# Patient Record
Sex: Female | Born: 1985 | Race: White | Hispanic: No | Marital: Single | Smoking: Never smoker
Health system: Southern US, Community
[De-identification: ages and names within clinical notes are randomized; demographics above are authoritative.]

## PROBLEM LIST (undated history)

## (undated) HISTORY — PX: BREAST ENHANCEMENT SURGERY: SHX7

## (undated) HISTORY — PX: TONSILLECTOMY: SUR1361

## (undated) HISTORY — PX: BREAST SURGERY: SHX581

## (undated) HISTORY — PX: OTHER SURGICAL HISTORY: SHX169

---
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
## 2008-09-26 ENCOUNTER — Emergency Department: Payer: Self-pay | Admitting: Unknown Physician Specialty

## 2011-08-07 ENCOUNTER — Emergency Department: Payer: Self-pay | Admitting: Emergency Medicine

## 2012-04-17 ENCOUNTER — Emergency Department: Payer: Self-pay | Admitting: *Deleted

## 2012-04-17 LAB — URINALYSIS, COMPLETE
Blood: NEGATIVE
Glucose,UR: NEGATIVE mg/dL (ref 0–75)
Leukocyte Esterase: NEGATIVE
Ph: 7 (ref 4.5–8.0)
Protein: NEGATIVE
RBC,UR: 1 /HPF (ref 0–5)
Specific Gravity: 1.018 (ref 1.003–1.030)
Squamous Epithelial: <1

## 2012-04-17 LAB — COMPREHENSIVE METABOLIC PANEL
Bilirubin,Total: 0.5 mg/dL (ref 0.2–1.0)
Chloride: 107 mmol/L (ref 98–107)
Co2: 25 mmol/L (ref 21–32)
Creatinine: 0.79 mg/dL (ref 0.60–1.30)
Glucose: 100 mg/dL — ABNORMAL HIGH (ref 65–99)
Osmolality: 287 (ref 275–301)
Potassium: 3.4 mmol/L — ABNORMAL LOW (ref 3.5–5.1)
SGOT(AST): 25 U/L (ref 15–37)
Total Protein: 7.3 g/dL (ref 6.4–8.2)

## 2012-04-17 LAB — CBC: MCH: 29.6 pg (ref 26.0–34.0)

## 2012-04-17 LAB — PREGNANCY, URINE: Pregnancy Test, Urine: NEGATIVE m[IU]/mL

## 2012-04-17 LAB — LIPASE, BLOOD: Lipase: 135 U/L (ref 73–393)

## 2012-07-09 ENCOUNTER — Ambulatory Visit: Payer: Self-pay | Admitting: Otolaryngology

## 2012-07-18 ENCOUNTER — Observation Stay: Payer: Self-pay | Admitting: Otolaryngology

## 2014-12-31 ENCOUNTER — Emergency Department: Payer: Self-pay | Admitting: Emergency Medicine

## 2015-03-01 NOTE — H&P (Signed)
PATIENT NAME:  Tammy PandaBNER, Tammy Tammy [name] MR#:  [id number] DATE OF BIRTH:  [birth year]  DATE OF ADMISSION:  07/18/2012  HISTORY OF PRESENT ILLNESS: The patient is Tammy [name] 29 year old white female who had Tammy [name] tonsillectomy 1 week ago with Dr. Bud Facereighton Vaught. She was brushing her teeth this evening, does not recall specifically that she hit her left oropharynx but started bleeding. She irrigated with cold water. The bleeding seemed to stop momentarily but then "broke loose again." She was initially evaluated in the emergency room by Dr. Manson PasseyBrown who immediately called me. I attempted cautery in the emergency room but she was unable to tolerate that cautery very well, and she continued to bleed primarily from the inferior pole of the left tonsil. Tammy [name] decision was made to take her to the operating room. Please see separate documentation for operative findings.   ALLERGIES: None.   MEDICATIONS: Lortab and Augmentin.   PAST MEDICAL HISTORY: Recurrent tonsillitis.   PAST SURGICAL HISTORY: Tonsillectomy 1 week ago.   SOCIAL HISTORY: The patient is Tammy [name] nonsmoker.   PHYSICAL EXAMINATION: There is constant bleeding from the left inferior tonsillar pole.  Remainder of her head and neck exam is normal.  IMPRESSION: Post-tonsillectomy hemorrhage. We have elected to go to the operating room, where we will get complete control of the bleeding.     ____________________________ J. Gertie BaronMadison Zyrah Wiswell, MD jmc:vtd D: 07/18/2012 07:49:56 ET   T: 07/18/2012 11:50:34 ET   JOB#: [id number] cc: Zackery BarefootJ. Madison Antario Yasuda, MD, <Dictator> Glorious PeachSonya Thompson [facility] Wendee CoppJMADISON Tallulah Hosman MD ELECTRONICALLY SIGNED 07/26/2012 10:43

## 2015-03-01 NOTE — Op Note (Signed)
PATIENT NAME:  Tammy PandaBNER, Tammy [name] MR#:  [id number] DATE OF BIRTH:  07-Sep-1986  DATE OF PROCEDURE:  07/18/2012  PREOPERATIVE DIAGNOSIS:  Post-tonsillectomy hemorrhage.   POSTOPERATIVE DIAGNOSIS:  Post-tonsillectomy hemorrhage.   PROCEDURE: Control of post-tonsillectomy hemorrhage.   SURGEON:  Gertie BaronMadison Nohelani Benning, M.D.   DESCRIPTION OF PROCEDURE: The patient was placed in the supine position on the operating room table. After general endotracheal anesthesia had been induced, the patient was turned from anesthesia and the tonsillectomy retractor was placed and suspended off the [employer] stand. The oropharynx was suctioned of significant fresh blood.  The primary bleeding source was in the left inferior tonsillar pole where suction cautery was used to stop the venous type bleeding. The remaining tonsillar eschar was irrigated and gently with [name] gloved finger rubbed to make sure that no additional bleeding was encountered. The patient was given [name] Valsalva maneuver and no additional bleeding was encountered with that after [name] very conservative amount of cautery was used for any additional small bleeding. The stomach was then suctioned clear of coagulated dark blood (approximately 150 mL). The nasopharynx was irrigated. The patient was then returned to anesthesia, allowed to emerge from anesthesia, extubated in the operating room, and taken to the recovery room in stable condition. There were no complications. Estimated blood loss 15 mL.   ____________________________ J. Gertie BaronMadison Verdella Laidlaw, MD jmc:bjt D: 07/18/2012 07:46:34 ET T: 07/18/2012 13:30:28 ET JOB#: [id number]  cc: Zackery BarefootJ. Madison Ardeth Repetto, MD, <Dictator> Wendee CoppJMADISON Jonathen Rathman MD ELECTRONICALLY SIGNED 07/26/2012 10:43

## 2015-04-20 LAB — OB RESULTS CONSOLE HIV ANTIBODY (ROUTINE TESTING): HIV: NONREACTIVE

## 2015-04-20 LAB — OB RESULTS CONSOLE HEPATITIS B SURFACE ANTIGEN: HEP B S AG: NEGATIVE

## 2015-04-20 LAB — OB RESULTS CONSOLE RUBELLA ANTIBODY, IGM: RUBELLA: IMMUNE

## 2015-04-20 LAB — OB RESULTS CONSOLE VARICELLA ZOSTER ANTIBODY, IGG: Varicella: IMMUNE

## 2015-07-19 LAB — OB RESULTS CONSOLE GBS: STREP GROUP B AG: NEGATIVE

## 2015-07-19 LAB — OB RESULTS CONSOLE GC/CHLAMYDIA
CHLAMYDIA, DNA PROBE: NEGATIVE
Gonorrhea: NEGATIVE

## 2015-08-01 ENCOUNTER — Encounter
Admission: RE | Admit: 2015-08-01 | Discharge: 2015-08-01 | Disposition: A | Discharge status: Home / Self Care | Payer: BLUE CROSS/BLUE SHIELD | Source: Ambulatory Visit | Attending: Obstetrics and Gynecology | Admitting: Obstetrics and Gynecology

## 2015-08-01 LAB — CBC
HEMATOCRIT: 37 % (ref 35.0–47.0)
HEMOGLOBIN: 12.3 g/dL (ref 12.0–16.0)
MCH: 28.8 pg (ref 26.0–34.0)
MCHC: 33.3 g/dL (ref 32.0–36.0)
MCV: 86.6 fL (ref 80.0–100.0)
PLATELETS: 214 10*3/uL (ref 150–440)
RBC: 4.28 MIL/uL (ref 3.80–5.20)
RDW: 13.9 % (ref 11.5–14.5)
WBC: 9.3 10*3/uL (ref 3.6–11.0)

## 2015-08-01 LAB — RAPID HIV SCREEN (HIV 1/2 AB+AG)
HIV 1/2 Antibodies: NONREACTIVE
HIV-1 P24 Antigen - HIV24: NONREACTIVE

## 2015-08-01 LAB — TYPE AND SCREEN
ABO/RH(D): O POS
ANTIBODY SCREEN: NEGATIVE

## 2015-08-01 NOTE — Plan of Care (Signed)
Tammy [name]   Your procedure is scheduled on: 08/02/2015   Report to [facility] at 0530am.             [facility]             [address]             [place], [place] [address]  Call this number if you have problems the morning of surgery: [phone]   Remember:   Do not eat food or drink liquids after midnight.     Do not wear jewelry, make-up or nail polish.  Do not wear lotions, powders, or perfumes. You may wear deodorant.  Do not shave prior to surgery.  Do not bring valuables to the hospital.  [facility] is not responsible for any            belongings or valuables.                Contacts, dentures or bridgework may not be worn into surgery.  Leave suitcase in the car. After surgery it may be brought to your room.  For patients admitted to the hospital, discharge time is determined by your             treatment team.               Special Instructions: Preparing the skin before Cesarean Section              To help prevent the risk of infection at your surgical site, we are providing             you with rinse-free Sage 2% Chlorhexidine Gluconate (HCG) disposable             wipes.               The night before surgery:              1. Shower or bathe with warm water             2. Do not apply lotion or perfume             3. Wait one hour after shower, skin should be dry and cool             4. Open Sage wipe package - 2 disposable cloths are inside             5. Wipe the lower abdomen from the pubic line to the navel and hip bone to hip             bone with one cloth             6. Use the second cloth to wipe the front of the upper thighs             7. Allow the area to dry for one minute. DO NOT RINSE             8. Skin may feel "tacky" for several minutes             9. Dress in freshly laundered, clean clothes           10. Do not shower the morning of surgery    Please read over the following fact sheets  that you were given: Coughing and            Deep Breathing and  Surgical Site Infection Prevention

## 2015-08-02 ENCOUNTER — Inpatient Hospital Stay
Admission: RE | Admit: 2015-08-02 | Discharge: 2015-08-05 | DRG: 765 | Disposition: A | Discharge status: Home / Self Care | Payer: BLUE CROSS/BLUE SHIELD | Source: Ambulatory Visit | Attending: Obstetrics and Gynecology | Admitting: Obstetrics and Gynecology

## 2015-08-02 ENCOUNTER — Encounter
Admission: RE | Disposition: A | Discharge status: Home / Self Care | Payer: Self-pay | Source: Ambulatory Visit | Attending: Obstetrics and Gynecology

## 2015-08-02 ENCOUNTER — Inpatient Hospital Stay: Payer: BLUE CROSS/BLUE SHIELD | Admitting: Anesthesiology

## 2015-08-02 ENCOUNTER — Encounter: Payer: Self-pay | Admitting: Anesthesiology

## 2015-08-02 DIAGNOSIS — O9081 Anemia of the puerperium: Secondary | ICD-10-CM | POA: Diagnosis not present

## 2015-08-02 DIAGNOSIS — D62 Acute posthemorrhagic anemia: Secondary | ICD-10-CM | POA: Diagnosis not present

## 2015-08-02 DIAGNOSIS — Z3A39 39 weeks gestation of pregnancy: Secondary | ICD-10-CM | POA: Diagnosis present

## 2015-08-02 DIAGNOSIS — O329XX0 Maternal care for malpresentation of fetus, unspecified, not applicable or unspecified: Secondary | ICD-10-CM

## 2015-08-02 DIAGNOSIS — O3421 Maternal care for scar from previous cesarean delivery: Secondary | ICD-10-CM | POA: Diagnosis present

## 2015-08-02 DIAGNOSIS — O321XX0 Maternal care for breech presentation, not applicable or unspecified: Secondary | ICD-10-CM | POA: Diagnosis present

## 2015-08-02 DIAGNOSIS — O0990 Supervision of high risk pregnancy, unspecified, unspecified trimester: Secondary | ICD-10-CM

## 2015-08-02 DIAGNOSIS — Z98891 History of uterine scar from previous surgery: Secondary | ICD-10-CM

## 2015-08-02 HISTORY — DX: Maternal care for malpresentation of fetus, unspecified, not applicable or unspecified: O32.9XX0

## 2015-08-02 HISTORY — DX: Supervision of high risk pregnancy, unspecified, unspecified trimester: O09.90

## 2015-08-02 HISTORY — DX: History of uterine scar from previous surgery: Z98.891

## 2015-08-02 LAB — RPR: RPR: NONREACTIVE

## 2015-08-02 LAB — ABO/RH: ABO/RH(D): O POS

## 2015-08-02 SURGERY — Surgical Case
Anesthesia: Spinal

## 2015-08-02 MED ORDER — NALBUPHINE HCL 10 MG/ML IJ SOLN
5.0000 mg | INTRAMUSCULAR | Status: DC | PRN
  Filled 2015-08-02: qty 0.5

## 2015-08-02 MED ORDER — BUPIVACAINE HCL 0.5 % IJ SOLN
INTRAMUSCULAR | Status: DC | PRN
Start: 2015-08-02 — End: 2015-08-02
  Administered 2015-08-02: 10 mL

## 2015-08-02 MED ORDER — PRENATAL MULTIVITAMIN CH
1.0000 | ORAL_TABLET | Freq: Every day | ORAL | Status: DC
  Administered 2015-08-03 – 2015-08-04 (×2): 1 via ORAL
  Filled 2015-08-02 (×2): qty 1

## 2015-08-02 MED ORDER — NALBUPHINE HCL 10 MG/ML IJ SOLN
5.0000 mg | Freq: Once | INTRAMUSCULAR | Status: DC | PRN
  Filled 2015-08-02: qty 0.5

## 2015-08-02 MED ORDER — BUPIVACAINE 0.25 % ON-Q PUMP DUAL CATH 400 ML
400.0000 mL | INJECTION | Status: DC
  Filled 2015-08-02: qty 400

## 2015-08-02 MED ORDER — FENTANYL CITRATE (PF) 100 MCG/2ML IJ SOLN
INTRAMUSCULAR | Status: DC | PRN
  Administered 2015-08-02: 150 ug via INTRAVENOUS

## 2015-08-02 MED ORDER — LANOLIN HYDROUS EX OINT: 1.0000 "application " | TOPICAL_OINTMENT | CUTANEOUS | Status: DC | PRN

## 2015-08-02 MED ORDER — PHENYLEPHRINE HCL 10 MG/ML IJ SOLN
INTRAMUSCULAR | Status: DC | PRN
  Administered 2015-08-02: 100 ug via INTRAVENOUS
  Administered 2015-08-02 (×2): 200 ug via INTRAVENOUS
  Administered 2015-08-02: 100 ug via INTRAVENOUS
  Administered 2015-08-02: 150 ug via INTRAVENOUS
  Administered 2015-08-02: 100 ug via INTRAVENOUS
  Administered 2015-08-02: 150 ug via INTRAVENOUS
  Administered 2015-08-02: 100 ug via INTRAVENOUS
  Administered 2015-08-02 (×2): 200 ug via INTRAVENOUS
  Administered 2015-08-02 (×2): 100 ug via INTRAVENOUS
  Administered 2015-08-02 (×2): 200 ug via INTRAVENOUS
  Administered 2015-08-02 (×3): 100 ug via INTRAVENOUS

## 2015-08-02 MED ORDER — MENTHOL 3 MG MT LOZG
1.0000 | LOZENGE | OROMUCOSAL | Status: DC | PRN
Start: 2015-08-02 — End: 2015-08-05

## 2015-08-02 MED ORDER — OXYTOCIN 40 UNITS IN LACTATED RINGERS INFUSION - SIMPLE MED
INTRAVENOUS | Status: DC | PRN
  Administered 2015-08-02: 1000 mL via INTRAVENOUS

## 2015-08-02 MED ORDER — KETOROLAC TROMETHAMINE 30 MG/ML IJ SOLN
30.0000 mg | Freq: Four times a day (QID) | INTRAMUSCULAR | Status: DC | PRN
  Administered 2015-08-02: 30 mg via INTRAVENOUS
  Filled 2015-08-02: qty 1

## 2015-08-02 MED ORDER — NALOXONE HCL 1 MG/ML IJ SOLN
1.0000 ug/kg/h | INTRAVENOUS | Status: DC | PRN
  Filled 2015-08-02: qty 2

## 2015-08-02 MED ORDER — CITRIC ACID-SODIUM CITRATE 334-500 MG/5ML PO SOLN
ORAL | Status: AC
  Administered 2015-08-02: 30 mL
  Filled 2015-08-02: qty 15

## 2015-08-02 MED ORDER — FERROUS SULFATE 325 (65 FE) MG PO TABS
325.0000 mg | ORAL_TABLET | Freq: Two times a day (BID) | ORAL | Status: DC
  Administered 2015-08-02 – 2015-08-04 (×5): 325 mg via ORAL
  Filled 2015-08-02 (×5): qty 1

## 2015-08-02 MED ORDER — ONDANSETRON HCL 4 MG/2ML IJ SOLN
INTRAMUSCULAR | Status: DC | PRN
  Administered 2015-08-02: 4 mg via INTRAVENOUS

## 2015-08-02 MED ORDER — OXYTOCIN 40 UNITS IN LACTATED RINGERS INFUSION - SIMPLE MED
INTRAVENOUS | Status: AC
  Filled 2015-08-02: qty 1000

## 2015-08-02 MED ORDER — BUPIVACAINE ON-Q PAIN PUMP (FOR ORDER SET NO CHG)
INJECTION | Status: DC
  Filled 2015-08-02: qty 1

## 2015-08-02 MED ORDER — LACTATED RINGERS IV SOLN
INTRAVENOUS | Status: DC | PRN
  Administered 2015-08-02: 07:00:00 via INTRAVENOUS

## 2015-08-02 MED ORDER — DIPHENHYDRAMINE HCL 50 MG/ML IJ SOLN
12.5000 mg | INTRAMUSCULAR | Status: DC | PRN
  Administered 2015-08-02: 12.5 mg via INTRAVENOUS
  Filled 2015-08-02: qty 1

## 2015-08-02 MED ORDER — MEPERIDINE HCL 25 MG/ML IJ SOLN: 6.2500 mg | INTRAMUSCULAR | Status: DC | PRN

## 2015-08-02 MED ORDER — ONDANSETRON HCL 4 MG/2ML IJ SOLN
4.0000 mg | Freq: Three times a day (TID) | INTRAMUSCULAR | Status: DC | PRN
  Administered 2015-08-02: 4 mg via INTRAVENOUS
  Filled 2015-08-02: qty 2

## 2015-08-02 MED ORDER — DIPHENHYDRAMINE HCL 25 MG PO CAPS: 25.0000 mg | ORAL_CAPSULE | ORAL | Status: DC | PRN

## 2015-08-02 MED ORDER — SODIUM CHLORIDE 0.9 % IJ SOLN: 3.0000 mL | INTRAMUSCULAR | Status: DC | PRN

## 2015-08-02 MED ORDER — NALOXONE HCL 0.4 MG/ML IJ SOLN: 0.4000 mg | INTRAMUSCULAR | Status: DC | PRN

## 2015-08-02 MED ORDER — SENNOSIDES-DOCUSATE SODIUM 8.6-50 MG PO TABS
2.0000 | ORAL_TABLET | ORAL | Status: DC
  Administered 2015-08-03 – 2015-08-04 (×3): 2 via ORAL
  Filled 2015-08-02 (×4): qty 2

## 2015-08-02 MED ORDER — DIBUCAINE 1 % RE OINT: 1.0000 "application " | TOPICAL_OINTMENT | RECTAL | Status: DC | PRN

## 2015-08-02 MED ORDER — BUPIVACAINE HCL (PF) 0.5 % IJ SOLN
INTRAMUSCULAR | Status: AC
  Filled 2015-08-02: qty 30

## 2015-08-02 MED ORDER — ONDANSETRON HCL 4 MG/2ML IJ SOLN: 4.0000 mg | Freq: Once | INTRAMUSCULAR | Status: DC | PRN

## 2015-08-02 MED ORDER — SCOPOLAMINE 1 MG/3DAYS TD PT72
1.0000 | MEDICATED_PATCH | Freq: Once | TRANSDERMAL | Status: AC
  Administered 2015-08-02: 1.5 mg via TRANSDERMAL
  Filled 2015-08-02: qty 1

## 2015-08-02 MED ORDER — BUPIVACAINE HCL 0.5 % IJ SOLN
5.0000 mL | Freq: Once | INTRAMUSCULAR | Status: DC
  Filled 2015-08-02: qty 5

## 2015-08-02 MED ORDER — WITCH HAZEL-GLYCERIN EX PADS: 1.0000 "application " | MEDICATED_PAD | CUTANEOUS | Status: DC | PRN

## 2015-08-02 MED ORDER — FENTANYL CITRATE (PF) 100 MCG/2ML IJ SOLN: 25.0000 ug | INTRAMUSCULAR | Status: DC | PRN

## 2015-08-02 MED ORDER — LACTATED RINGERS IV SOLN
INTRAVENOUS | Status: DC
  Administered 2015-08-03: 01:00:00 via INTRAVENOUS

## 2015-08-02 MED ORDER — SIMETHICONE 80 MG PO CHEW
80.0000 mg | CHEWABLE_TABLET | Freq: Three times a day (TID) | ORAL | Status: DC
  Administered 2015-08-02 – 2015-08-04 (×6): 80 mg via ORAL
  Filled 2015-08-02 (×6): qty 1

## 2015-08-02 MED ORDER — OXYTOCIN 40 UNITS IN LACTATED RINGERS INFUSION - SIMPLE MED: 62.5000 mL/h | INTRAVENOUS | Status: DC

## 2015-08-02 MED ORDER — KETOROLAC TROMETHAMINE 30 MG/ML IJ SOLN: 30.0000 mg | Freq: Four times a day (QID) | INTRAMUSCULAR | Status: DC | PRN

## 2015-08-02 MED ORDER — BUPIVACAINE HCL 0.5 % IJ SOLN: 5.0000 mL | Freq: Once | INTRAMUSCULAR | Status: DC

## 2015-08-02 MED ORDER — BUPIVACAINE IN DEXTROSE 0.75-8.25 % IT SOLN
INTRATHECAL | Status: DC | PRN
  Administered 2015-08-02: 12.5 mL via INTRATHECAL

## 2015-08-02 MED ORDER — MORPHINE SULFATE (PF) 0.5 MG/ML IJ SOLN
INTRAMUSCULAR | Status: DC | PRN
  Administered 2015-08-02: .2 mg via EPIDURAL

## 2015-08-02 MED ORDER — CEFAZOLIN SODIUM-DEXTROSE 2-3 GM-% IV SOLR
2.0000 g | INTRAVENOUS | Status: AC
  Administered 2015-08-02: 2 g via INTRAVENOUS
  Filled 2015-08-02: qty 50

## 2015-08-02 MED ORDER — DIPHENHYDRAMINE HCL 25 MG PO CAPS: 25.0000 mg | ORAL_CAPSULE | Freq: Four times a day (QID) | ORAL | Status: DC | PRN

## 2015-08-02 SURGICAL SUPPLY — 27 items
CANISTER SUCT 3000ML (MISCELLANEOUS) ×3 IMPLANT
CATH KIT ON-Q SILVERSOAK 5IN (CATHETERS) ×6 IMPLANT
CLOSURE WOUND 1/2 X4 (GAUZE/BANDAGES/DRESSINGS) ×1
DRSG TELFA 3X8 NADH (GAUZE/BANDAGES/DRESSINGS) ×3 IMPLANT
ELECT CAUTERY BLADE 6.4 (BLADE) ×3 IMPLANT
GAUZE SPONGE 4X4 12PLY STRL (GAUZE/BANDAGES/DRESSINGS) ×3 IMPLANT
GLOVE BIO SURGEON STRL SZ7 (GLOVE) ×12 IMPLANT
GLOVE INDICATOR 7.5 STRL GRN (GLOVE) ×12 IMPLANT
GOWN STRL REUS W/ TWL LRG LVL3 (GOWN DISPOSABLE) ×3 IMPLANT
GOWN STRL REUS W/TWL LRG LVL3 (GOWN DISPOSABLE) ×6
LIQUID BAND (GAUZE/BANDAGES/DRESSINGS) ×3 IMPLANT
NS IRRIG 1000ML POUR BTL (IV SOLUTION) ×3 IMPLANT
PACK C SECTION AR (MISCELLANEOUS) ×3 IMPLANT
PAD GROUND ADULT SPLIT (MISCELLANEOUS) ×3 IMPLANT
PAD OB MATERNITY 4.3X12.25 (PERSONAL CARE ITEMS) ×3 IMPLANT
PAD PREP 24X41 OB/GYN DISP (PERSONAL CARE ITEMS) ×3 IMPLANT
SPONGE LAP 18X18 5 PK (GAUZE/BANDAGES/DRESSINGS) ×6 IMPLANT
STRIP CLOSURE SKIN 1/2X4 (GAUZE/BANDAGES/DRESSINGS) ×2 IMPLANT
SUT CHROMIC GUT BROWN 0 54 (SUTURE) IMPLANT
SUT CHROMIC GUT BROWN 0 54IN (SUTURE)
SUT MNCRL 4-0 (SUTURE) ×2
SUT MNCRL 4-0 27XMFL (SUTURE) ×1
SUT PDS AB 1 TP1 96 (SUTURE) ×3 IMPLANT
SUT PLAIN 2 0 XLH (SUTURE) IMPLANT
SUT VIC AB 0 CT1 36 (SUTURE) ×15 IMPLANT
SUTURE MNCRL 4-0 27XMF (SUTURE) ×1 IMPLANT
SWABSTK COMLB BENZOIN TINCTURE (MISCELLANEOUS) IMPLANT

## 2015-08-02 NOTE — H&P (Signed)
History and Physical Interval Note:  Tammy [name]  has presented today for surgery, with the diagnosis of BREECH and prior cesarean delivery. The various methods of treatment have been discussed with the patient and family. After consideration of risks, benefits and other options for treatment, the patient has consented to  Procedure(s): CESAREAN SECTION (N/A) as a surgical intervention .  The patient's history has been reviewed, patient examined, no change in status, stable for surgery.  I have reviewed the patient's chart and labs.  Questions were answered to the patient's satisfaction.    The patient does not take a beta blocker and one is not indicated for this surgery.  Conard Novak, MD 08/02/2015 7:14 AM

## 2015-08-02 NOTE — Anesthesia Preprocedure Evaluation (Signed)
Anesthesia Evaluation  Patient identified by MRN, date of birth, ID band Patient awake    Reviewed: Allergy & Precautions, NPO status , Patient's Chart, lab work & pertinent test results, reviewed documented beta blocker date and time   Airway Mallampati: II  TM Distance: >3 FB     Dental  (+) Chipped   Pulmonary           Cardiovascular      Neuro/Psych    GI/Hepatic   Endo/Other    Renal/GU      Musculoskeletal   Abdominal   Peds  Hematology   Anesthesia Other Findings   Reproductive/Obstetrics                             Anesthesia Physical Anesthesia Plan  ASA: II  Anesthesia Plan: Spinal   Post-op Pain Management:    Induction:   Airway Management Planned:   Additional Equipment:   Intra-op Plan:   Post-operative Plan:   Informed Consent: I have reviewed the patients History and Physical, chart, labs and discussed the procedure including the risks, benefits and alternatives for the proposed anesthesia with the patient or authorized representative who has indicated his/her understanding and acceptance.     Plan Discussed with: CRNA  Anesthesia Plan Comments:         Anesthesia Quick Evaluation  

## 2015-08-02 NOTE — Anesthesia Procedure Notes (Signed)
Spinal Patient location during procedure: OR Staffing Anesthesiologist: Willo Yoon Performed by: anesthesiologist  Preanesthetic Checklist Completed: patient identified, site marked, surgical consent, pre-op evaluation, timeout performed, IV checked and risks and benefits discussed Spinal Block Patient position: sitting Prep: Betadine Patient monitoring: heart rate, cardiac monitor, continuous pulse ox and blood pressure Approach: midline Location: L3-4 Injection technique: single-shot Needle Needle type: Pencil-Tip  Needle gauge: 25 G Needle length: 9 cm Assessment Sensory level: T10 Additional Notes 12.5mg marcaine and 0.2mg astromorph.   

## 2015-08-02 NOTE — Op Note (Signed)
Cesarean Section Procedure Note   Tammy [name]   08/02/2015   Pre-operative Diagnosis: 1) intrauterine pregnancy at [redacted]w[redacted]d, 2) history of prior cesarean delivery, desires repeat, 3) breech presentation  Post-operative Diagnosis: 1) intrauterine pregnancy at [redacted]w[redacted]d, 2) history of prior cesarean delivery, desires repeat, 3) breech presentation  Procedure: repeat low transverse cesarean delivery via pfannentiel incision  Surgeon: Surgeon(s) and Role:    * Conard Novak, MD - Primary   Assistants: Ranae Plumber, MD  Anesthesia: spinal   Findings:  1) normal appearing gravid uterus, fallopian tubes, and ovaries 2) viable female infant with APGARS of 9 at 1 minute and 9 at 5 minutes with weight of 3,399 grams   Estimated Blood Loss: 750 mL  Total IV Fluids: 1,500 ml   Urine Output: 150 mL clear urine at end of procedure  Specimens: none  Complications: no complications  Disposition: PACU - hemodynamically stable.   Maternal Condition: stable   Baby condition / location:  Couplet care / Skin to Skin  Procedure Details:  The patient was seen in the Holding Room. The risks, benefits, complications, treatment options, and expected outcomes were discussed with the patient. The patient concurred with the proposed plan, giving informed consent. identified as Tammy [name] and the procedure verified as C-Section Delivery. A Time Out was held and the above information confirmed.   After induction of anesthesia, the patient was draped and prepped in the usual sterile manner. A Pfannenstiel incision was made and carried down through the subcutaneous tissue to the fascia. Fascial incision was made and extended transversely. The fascia was separated from the underlying rectus tissue superiorly and inferiorly. The peritoneum was identified and entered. Peritoneal incision was extended longitudinally. The bladder flap was not bluntly freed from the lower uterine segment. A low transverse  uterine incision was made and the hysterotomy was extended with cranial-caudal tension. Delivered from frank breech  presentation was a 3,399 gram Living newborn infant(s) with Apgar scores of 9 at one minute and 9 at five minutes. Cord ph was not sent the umbilical cord was clamped and cut cord blood was obtained for evaluation. The placenta was removed Intact and appeared  to have an succenturiate lobe. The uterine outline, tubes and ovaries appeared normal. The uterine incision was closed with running locked sutures of 0 Vicryl.  A second layer of the same suture was thrown in an imbricating fashion.  Hemostasis was assured.  The uterus was retained to the abdomen and the paracolic gutters were cleared of all clots and debris.  The peritoneum was reapproximated using 0 vicryl in a running fashion. The rectus muscles were inspected and found to be hemostatic.  The On-Q catheter pumps were inserted in accordance with the manufacturer's recommendations.  The catheters were inserted approximately 4cm cephelad to the incision line, approximately 1cm apart, straddling the midline.  They were inserted to a depth of the 4th mark. They were positioned superficial to the rectus abdominus muscles and deep to the rectus fascia.    The fascia was then reapproximated with running sutures of 1-0 PDS, looped. The subcutaneous tissue was reapproximated in 3 interrupted stitches to reduce tension on the skin closure.The subcuticular closure was performed using 4-0 monocryl. The skin closure was reinforced using benzoin and 1/2" steri-strips.  The On-Q catheters were bolused with 5 mL of 0.5% marcaine plain for a total of 10 mL.  The catheters were affixed to the skin with surgical skin glue, steri-strips, and tegaderm.  Instrument, sponge, and needle counts were correct prior the abdominal closure and were correct at the conclusion of the case.  The patient received Ancef 2 gram IV prior to skin incision (within 30  minutes). For VTE prophylaxis she was wearing SCDs throughout the case.   Signed: Conard Novak, MD, FACOG 08/02/2015 8:54 AM

## 2015-08-02 NOTE — Transfer of Care (Signed)
Immediate Anesthesia Transfer of Care Note  Patient: Tammy [name]  Procedure(s) Performed: Procedure(s): CESAREAN SECTION (N/A)  Patient Location: PACU  Anesthesia Type:Spinal  Level of Consciousness: awake, alert , oriented and patient cooperative  Airway & Oxygen Therapy: Patient Spontanous Breathing  Post-op Assessment: Report given to RN and Post -op Vital signs reviewed and stable  Post vital signs: Reviewed and stable  Last Vitals:  Filed Vitals:   08/02/15 0908  BP: 119/43  Pulse:   Temp: 34.8 C  Resp: 16    Complications: No apparent anesthesia complications

## 2015-08-02 NOTE — Discharge Summary (Signed)
Obstetrical Discharge Summary  Date of Admission: 08/02/2015 Date of Discharge: 08/05/2015  Primary OB: [facility]  Gestational Age at Delivery: [redacted]w[redacted]d   Antepartum complications: history of cesarean delivery, breech presentation of fetus Reason for Admission: schedule repeat cesarean delivery Date of Delivery:  08/02/2015  Delivered By: Thomasene Mohair, MD Delivery Type: repeat cesarean section, low transverse incision Intrapartum complications/course: None Anesthesia: Spinal Placenta: Manual Laceration: n/a Episiotomy: none Newborn Data: Live born female  Birth Weight: 7 lb 7.9 oz (3399 g) APGAR: 9, 9  Discharge Physical Exam:  BP 111/78 mmHg  Pulse 68  Temp(Src) 97.7 F (36.5 C) (Oral)  Resp 20  Ht  (1.626 m)  Wt 150 lb (68.04 kg)  BMI 25.73 kg/m2  SpO2 98%  LMP 10/18/2014  Breastfeeding? Unknown  General: NAD Abdomen: c/d/i incision with steri strips in place. On q pump empty-->removed and site is c/d/i; +BS, soft, nttp, nd, FF below the umbilicus Perineum: no gross VB Skin:  Warm and dry.  Cardiovascular:Regular rate and rhythm. Respiratory:  Clear to auscultation bilateral. Normal respiratory effort Extremities: no c/c/e  HEMOGLOBIN  Date Value Ref Range Status  08/03/2015 11.2* 12.0 - 16.0 g/dL Final   HGB  Date Value Ref Range Status  04/17/2012 12.2 12.0-16.0 g/dL Final   HCT  Date Value Ref Range Status  08/03/2015 33.4* 35.0 - 47.0 % Final  04/17/2012 36.0 35.0-47.0 % Final    Post partum course: uncomplicated Postpartum Procedures: none Disposition: stable, discharge to home.  Rh Immune globulin given: n/a Rubella vaccine given: n/a Tdap vaccine given in AP or PP setting: given 06/01/2015 Flu vaccine given in AP or PP setting: no  Contraception: nexplanon  Prenatal Labs: O POS / Rubella Immune / Varicella Immune/ RPR negative / HIV negative / HepBsAg negative / Tdap UTD: Yes/pap needed PP / Breast / Contraception: nexplanon /  Follow up: [facility] 1wk for incision check  Plan:  Tammy [name] was discharged to home in good condition.  Follow-up appointment at [facility] with Dr Jean Rosenthal in 1 week for skin incision check.   Discharge Medications:   Medication List    TAKE these medications        ibuprofen 600 MG tablet  Commonly known as:  ADVIL,MOTRIN  Take 1 tablet (600 mg total) by mouth every 6 (six) hours as needed for fever, headache or mild pain.     oxyCODONE-acetaminophen 5-325 MG per tablet  Commonly known as:  PERCOCET/ROXICET  Take 1-2 tablets by mouth every 6 (six) hours as needed for moderate pain or severe pain.     prenatal multivitamin Tabs tablet  Take 1 tablet by mouth daily at 12 noon.     simethicone 80 MG chewable tablet  Commonly known as:  MYLICON  Chew 1 tablet (80 mg total) by mouth 4 (four) times daily as needed for flatulence.        Cornelia Copa MD [facility]  Pager: [phone]

## 2015-08-03 LAB — CBC
HEMATOCRIT: 33.4 % — AB (ref 35.0–47.0)
HEMOGLOBIN: 11.2 g/dL — AB (ref 12.0–16.0)
MCH: 28.9 pg (ref 26.0–34.0)
MCHC: 33.5 g/dL (ref 32.0–36.0)
MCV: 86.2 fL (ref 80.0–100.0)
PLATELETS: 182 10*3/uL (ref 150–440)
RBC: 3.87 MIL/uL (ref 3.80–5.20)
RDW: 13.7 % (ref 11.5–14.5)
WBC: 12.7 10*3/uL — AB (ref 3.6–11.0)

## 2015-08-03 MED ORDER — IBUPROFEN 600 MG PO TABS
600.0000 mg | ORAL_TABLET | Freq: Four times a day (QID) | ORAL | Status: DC | PRN
  Administered 2015-08-03 – 2015-08-05 (×6): 600 mg via ORAL
  Filled 2015-08-03 (×6): qty 1

## 2015-08-03 MED ORDER — OXYCODONE-ACETAMINOPHEN 5-325 MG PO TABS
1.0000 | ORAL_TABLET | ORAL | Status: DC | PRN
  Administered 2015-08-03: 1 via ORAL
  Administered 2015-08-03 – 2015-08-04 (×2): 2 via ORAL
  Administered 2015-08-04 – 2015-08-05 (×3): 1 via ORAL
  Filled 2015-08-03 (×5): qty 2
  Filled 2015-08-03 (×2): qty 1
  Filled 2015-08-03: qty 2

## 2015-08-03 NOTE — Anesthesia Post-op Follow-up Note (Signed)
  Anesthesia Pain Follow-up Note  Patient: Tammy [name]  Day #: 1  Date of Follow-up: 08/03/2015 Time: 7:55 AM  Last Vitals:  Filed Vitals:   08/03/15 0412  BP: 91/52  Pulse: 84  Temp: 36.5 C  Resp: 16    Level of Consciousness: alert  Pain: mild   Side Effects:None  Catheter Site Exam:clean  Plan: D/C from anesthesia care  Rica Mast

## 2015-08-03 NOTE — Anesthesia Postprocedure Evaluation (Addendum)
  Anesthesia Post-op Note  Patient: Tammy [name]  Procedure(s) Performed: Procedure(s): CESAREAN SECTION (N/A)  Anesthesia type:Spinal  Patient location: 337  Post pain: Pain level controlled  Post assessment: Post-op Vital signs reviewed, Patient's Cardiovascular Status Stable, Respiratory Function Stable, Patent Airway and No signs of Nausea or vomiting  Post vital signs: Reviewed and stable  Last Vitals:  Filed Vitals:   08/03/15 0412  BP: 91/52  Pulse: 84  Temp: 36.5 C  Resp: 16    Level of consciousness: awake, alert  and patient cooperative  Complications: No apparent anesthesia complications

## 2015-08-03 NOTE — Addendum Note (Signed)
Addendum  created 08/03/15 0759 by Irving Burton, CRNA   Modules edited: Notes Section   Notes Section:  File: [id number]

## 2015-08-03 NOTE — Progress Notes (Signed)
  Subjective:  Doing well pain well controlled.  Tolerating po no N/V.  Minimal lochia  Objective:  Blood pressure 102/68, pulse 76, temperature 97.8 F (36.6 C), temperature source Oral, resp. rate 16, height  (1.626 m), weight 68.04 kg (150 lb), last menstrual period 10/18/2014, SpO2 99 %, unknown if currently breastfeeding.  Intake/Output Summary (Last 24 hours) at 08/03/15 0904 Last data filed at 08/03/15 0600  Gross per 24 hour  Intake    240 ml  Output   3950 ml  Net  -3710 ml   General: NAD Pulmonary: no increased work of breathing Abdomen: non-distended, non-tender, fundus firm at level of umbilicus Incision: Dressing D/C/I Extremities: no edema, no erythema, no tenderness  Results for orders placed or performed during the hospital encounter of 08/02/15 (from the past 72 hour(s))  ABO/Rh     Status: None   Collection Time: 08/01/15 12:05 PM  Result Value Ref Range   ABO/RH(D) O POS   CBC     Status: Abnormal   Collection Time: 08/03/15  6:28 AM  Result Value Ref Range   WBC 12.7 (H) 3.6 - 11.0 K/uL   RBC 3.87 3.80 - 5.20 MIL/uL   Hemoglobin 11.2 (L) 12.0 - 16.0 g/dL   HCT 78.2 (L) 95.6 - 21.3 %   MCV 86.2 80.0 - 100.0 fL   MCH 28.9 26.0 - 34.0 pg   MCHC 33.5 32.0 - 36.0 g/dL   RDW 08.6 57.8 - 46.9 %   Platelets 182 150 - 440 K/uL     Assessment:   29 y.o. G2P2002 postoperativeday #1 RLTCS   Plan:  1) Acute blood loss anemia - hemodynamically stable and asymptomatic - po ferrous sulfate  2) O pos / RI / VZI  3) TDAP status received 7/20, discussed influenza vaccination  4) Breast/undecided  5) Disposition anticipate discharge POD2-3

## 2015-08-04 NOTE — Progress Notes (Signed)
POD #2 s/p Cesarean section for breech presentation  Subjective:   Breastfeeding going well. Some nipple soreness. Baby cluster feeding. Passing flatus Tolerating regular diet. Voiding without difficulty. Pain managed with analgesics. Not ready for discharge today  Objective:  Blood pressure 99/60, pulse 72, temperature 98 F (36.7 C), temperature source Oral, resp. rate 16, height  (1.626 m), weight 150 lb (68.04 kg), last menstrual period 10/18/2014, SpO2 98 %, unknown if currently breastfeeding.  General: NAD Pulmonary: no increased work of breathing, CTA Heart: RRR without murmur Abdomen: non-distended, non-tender, BS active Incision:  Dressing C+D+I Extremities: no edema, no erythema, no tenderness  Results for orders placed or performed during the hospital encounter of 08/02/15 (from the past 72 hour(s))  ABO/Rh     Status: None   Collection Time: 08/01/15 12:05 PM  Result Value Ref Range   ABO/RH(D) O POS   CBC     Status: Abnormal   Collection Time: 08/03/15  6:28 AM  Result Value Ref Range   WBC 12.7 (H) 3.6 - 11.0 K/uL   RBC 3.87 3.80 - 5.20 MIL/uL   Hemoglobin 11.2 (L) 12.0 - 16.0 g/dL   HCT 16.1 (L) 09.6 - 04.5 %   MCV 86.2 80.0 - 100.0 fL   MCH 28.9 26.0 - 34.0 pg   MCHC 33.5 32.0 - 36.0 g/dL   RDW 40.9 81.1 - 91.4 %   Platelets 182 150 - 440 K/uL     Assessment:   29 y.o. G2P2001 postoperativeday # 2-stable  Plan:  1) Continue postoperative and postpartum care   2) O POS /Rubella Immune (06/08 0000) / Varicella Immune  3) TDAP UTD   4) Breastfeeding  5) Disposition-discharge in the next 1-2days  GUTIERREZ, COLLEEN, CNM

## 2015-08-05 MED ORDER — OXYCODONE-ACETAMINOPHEN 5-325 MG PO TABS: 1.0000 | ORAL_TABLET | Freq: Four times a day (QID) | ORAL | Status: AC | PRN

## 2015-08-05 MED ORDER — SIMETHICONE 80 MG PO CHEW: 80.0000 mg | CHEWABLE_TABLET | Freq: Four times a day (QID) | ORAL | Status: DC | PRN

## 2015-08-05 MED ORDER — IBUPROFEN 600 MG PO TABS: 600.0000 mg | ORAL_TABLET | Freq: Four times a day (QID) | ORAL | Status: DC | PRN

## 2015-08-05 NOTE — Progress Notes (Signed)
Mom was educated and taught with the teach back method on how to supplement with formula using the SNS mode. Mom was instructed to supplement with at least 15 ml of formula at a minimum of twice a day.  Patient understands all discharge instructions and the need to make follow up appointments. Patient discharge via wheelchair with auxillary.

## 2015-08-05 NOTE — Progress Notes (Addendum)
Daily Post Partum Note  Tammy ABDELAZIZ is a 29 y.o. [id number]  POD#3 s/p rpt LTCS for breech @ [redacted]w[redacted]d.  Pregnancy c/b prior c-section  24hr/overnight events:  none  Subjective:  Meeting all PP/PO goals; has had flatus. On q site is leaking  Objective:    Current Vital Signs 24h Vital Sign Ranges  T 97.7 F (36.5 C) Temp  Avg: 97.9 F (36.6 C)  Min: 97.4 F (36.3 C)  Max: 98.3 F (36.8 C)  BP 111/78 mmHg BP  Min: 99/60  Max: 111/78  HR 68 Pulse  Avg: 70  Min: 68  Max: 72  RR 20 Resp  Avg: 18  Min: 16  Max: 20  SaO2 98 % Not Delivered SpO2  Avg: 98 %  Min: 98 %  Max: 98 %       24 Hour I/O Current Shift I/O  Time Ins Outs 09/22 0701 - 09/23 0700 In: 240 [P.O.:240] Out: -       General: NAD Abdomen: c/d/i incision with steri strips in place. On q pump empty-->removed and site is c/d/i; +BS, soft, nttp, nd, FF below the umbilicus Perineum: no gross VB Skin:  Warm and dry.  Cardiovascular:Regular rate and rhythm. Respiratory:  Clear to auscultation bilateral. Normal respiratory effort Extremities: no c/c/e  Medications Current Facility-Administered Medications  Medication Dose Route Frequency Provider Last Rate Last Dose  . bupivacaine 0.25 % ON-Q pump DUAL CATH 400 mL  400 mL Other Continuous Conard Novak, MD   400 mL at 08/02/15 4540  . bupivacaine ON-Q pain pump   Other Continuous Conard Novak, MD      . witch hazel-glycerin (TUCKS) pad 1 application  1 application Topical PRN Conard Novak, MD       And  . dibucaine (NUPERCAINAL) 1 % rectal ointment 1 application  1 application Rectal PRN Conard Novak, MD      . diphenhydrAMINE (BENADRYL) injection 12.5 mg  12.5 mg Intravenous Q4H PRN Berdine Addison, MD   12.5 mg at 08/02/15 1229   Or  . diphenhydrAMINE (BENADRYL) capsule 25 mg  25 mg Oral Q4H PRN Berdine Addison, MD      . diphenhydrAMINE (BENADRYL) capsule 25 mg  25 mg Oral Q6H PRN Conard Novak, MD      . ferrous sulfate tablet 325 mg  325 mg Oral  BID WC Conard Novak, MD   325 mg at 08/04/15 1726  . ibuprofen (ADVIL,MOTRIN) tablet 600 mg  600 mg Oral Q6H PRN Vena Austria, MD   600 mg at 08/05/15 0730  . lactated ringers infusion   Intravenous Continuous Conard Novak, MD   Stopped at 08/03/15 [phone]  . lanolin ointment 1 application  1 application Topical PRN Conard Novak, MD      . menthol-cetylpyridinium (CEPACOL) lozenge 3 mg  1 lozenge Oral Q2H PRN Conard Novak, MD      . naloxone [facility]) injection 0.4 mg  0.4 mg Intravenous PRN Berdine Addison, MD       And  . sodium chloride 0.9 % injection 3 mL  3 mL Intravenous PRN Berdine Addison, MD      . ondansetron [facility]) injection 4 mg  4 mg Intravenous Q8H PRN Berdine Addison, MD   4 mg at 08/02/15 1224  . oxyCODONE-acetaminophen (PERCOCET/ROXICET) 5-325 MG per tablet 1-2 tablet  1-2 tablet Oral Q4H PRN Vena Austria, MD   1 tablet at 08/05/15 0730  . prenatal  multivitamin tablet 1 tablet  1 tablet Oral Q1200 Conard Novak, MD   1 tablet at 08/04/15 1726  . scopolamine (TRANSDERM-SCOP) 1 MG/3DAYS 1.5 mg  1 patch Transdermal Once Berdine Addison, MD   1.5 mg at 08/02/15 1224  . senna-docusate (Senokot-S) tablet 2 tablet  2 tablet Oral Q24H Conard Novak, MD   2 tablet at 08/04/15 2357  . simethicone (MYLICON) chewable tablet 80 mg  80 mg Oral TID PC Conard Novak, MD   80 mg at 08/04/15 1726     Recent Labs Lab 08/01/15 1204 08/03/15 0628  WBC 9.3 12.7*  HGB 12.3 11.2*  HCT 37.0 33.4*  PLT 214 182    Assessment & Plan:  Pt doing well *Postpartum/postop: routine care *Dispo:home today  O POS / Rubella Immune / Varicella Immune/  RPR negative / HIV negative / HepBsAg negative / Tdap UTD: Yes/pap needed PP / Breast  / Contraception: nexplanon / Follow up: [facility] 1wk for incision check  Cornelia Copa MD [facility]BG[facility] Pager [phone]

## 2015-08-05 NOTE — Discharge Instructions (Signed)
° °Cesarean Delivery, Care After °Refer to this sheet in the next few weeks. These instructions provide you with information on caring for yourself after your procedure. Your health care provider may also give you specific instructions. Your treatment has been planned according to current medical practices, but problems sometimes occur. Call your health care provider if you have any problems or questions after you go home. °HOME CARE INSTRUCTIONS  °· If you have an On-Q pump, remove it on the 5th day after your surgery, by removing the dressing/bandage and pulling the pump out. Cover the site where the pump strings came out with a band-aid, as needed. °· Only take over-the-counter or prescription medications as directed by your health care provider. °· Do not drink alcohol, especially if you are breastfeeding or taking medication to relieve pain. °· Do not  smoke tobacco. °· Continue to use good perineal care. Good perineal care includes: °¨ Wiping your perineum from front to back. °¨ Keeping your perineum clean. °· Check your surgical cut (incision) daily for increased redness, drainage, swelling, or separation of skin. °· Shower and clean your incision gently with soap and water every day, by letting warm and soapy water run over the incision, and then pat it dry. If your health care provider says it is okay, leave the incision uncovered. Use a bandage (dressing) if the incision is draining fluid or appears irritated. If the adhesive strips across the incision do not fall off within 7 days, carefully peel them off, after a shower. °· Hug a pillow when coughing or sneezing until your incision is healed. This helps to relieve pain. °· Do not use tampons, douches or have sexual intercourse, until your health care provider says it is okay. °· Wear a well-fitting bra that provides breast support. °· Limit wearing support panties or control-top hose. °· Drink enough fluids to keep your urine clear or pale  yellow. °· Eat high-fiber foods such as whole grain cereals and breads, brown rice, beans, and fresh fruits and vegetables every day. These foods may help prevent or relieve constipation. °· Resume activities such as climbing stairs, driving, lifting, exercising, or traveling as directed by your health care provider. °· Try to have someone help you with your household activities and your newborn for at least a few days after you leave the hospital. °· Rest as much as possible. Try to rest or take a nap when your newborn is sleeping. °· Increase your activities gradually. °· Do not lift more than 15lbs until directed by a provider. °· Keep all of your scheduled postpartum appointments. It is very important to keep your scheduled follow-up appointments. At these appointments, your health care provider will be checking to make sure that you are healing physically and emotionally. °SEEK MEDICAL CARE IF:  °· You are passing large clots from your vagina. Save any clots to show your health care provider. °· You have a foul smelling discharge from your vagina. °· You have trouble urinating. °· You are urinating frequently. °· You have pain when you urinate. °· You have a change in your bowel movements. °· You have increasing redness, pain, or swelling near your incision. °· You have pus draining from your incision. °· Your incision is separating. °· You have painful, hard, or reddened breasts. °· You have a severe headache. °· You have blurred vision or see spots. °· You feel sad or depressed. °· You have thoughts of hurting yourself or your newborn. °· You have questions about your   care, the care of your newborn, or medications. °· You are dizzy or light-headed. °· You have a rash. °· You have pain, redness, or swelling at the site of the removed intravenous access (IV) tube. °· You have nausea or vomiting. °· You stopped breastfeeding and have not had a menstrual period within 12 weeks of stopping. °· You are not  breastfeeding and have not had a menstrual period within 12 weeks of delivery. °· You have a fever. °SEEK IMMEDIATE MEDICAL CARE IF: °· You have persistent pain. °· You have chest pain. °· You have shortness of breath. °· You faint. °· You have leg pain. °· You have stomach pain. °· Your vaginal bleeding saturates 2 or more sanitary pads in 1 hour. °MAKE SURE YOU:  °· Understand these instructions. °· Will watch your condition. °· Will get help right away if you are not doing well or get worse. °Document Released: 07/21/2002 Document Revised: 03/15/2014 Document Reviewed: 06/25/2012 °[employer]® Patient Information ©2015 [employer]. This information is not intended to replace advice given to you by your health care provider. Make sure you discuss any questions you have with your health care provider. ° ° °

## 2019-11-03 ENCOUNTER — Other Ambulatory Visit: Payer: Self-pay

## 2019-11-03 ENCOUNTER — Encounter: Payer: Self-pay | Admitting: Emergency Medicine

## 2019-11-03 ENCOUNTER — Ambulatory Visit
Admission: EM | Admit: 2019-11-03 | Discharge: 2019-11-03 | Disposition: A | Discharge status: Home / Self Care | Payer: BLUE CROSS/BLUE SHIELD

## 2019-11-03 DIAGNOSIS — R300 Dysuria: Secondary | ICD-10-CM | POA: Insufficient documentation

## 2019-11-03 LAB — POCT URINALYSIS DIP (MANUAL ENTRY)
Bilirubin, UA: NEGATIVE
Glucose, UA: NEGATIVE mg/dL
Ketones, POC UA: NEGATIVE mg/dL
Leukocytes, UA: NEGATIVE
Nitrite, UA: NEGATIVE
Protein Ur, POC: NEGATIVE mg/dL
Spec Grav, UA: 1.01 (ref 1.010–1.025)
Urobilinogen, UA: 0.2 E.U./dL
pH, UA: 6.5 (ref 5.0–8.0)

## 2019-11-03 NOTE — ED Provider Notes (Signed)
Roderic Palau    CSN: [id number] Arrival date & time: 11/03/19  1011      History   Chief Complaint Chief Complaint  Patient presents with  . Urinary Tract Infection    HPI Tammy [name] is a 33 y.o. female.   Patient presents with dysuria since yesterday evening.  She denies fever, chills, urinary frequency, abdominal pain, back pain, flank pain, vaginal discharge, pelvic pain, rash, lesions, or other symptoms.  No treatments attempted at home.  LMP: 10/26/2019.  The history is provided by the patient.    History reviewed. No pertinent past medical history.  Patient Active Problem List   Diagnosis Date Noted  . High-risk pregnancy 08/02/2015  . History of cesarean delivery 08/02/2015  . Malpresentation of fetus, antepartum 08/02/2015  . S/P cesarean section 08/02/2015    Past Surgical History:  Procedure Laterality Date  . BREAST ENHANCEMENT SURGERY Bilateral   . BREAST SURGERY    . CESAREAN SECTION    . CESAREAN SECTION N/A 08/02/2015   Procedure: CESAREAN SECTION;  Surgeon: Will Bonnet, MD;  Location: [facility] ORS;  Service: Obstetrics;  Laterality: N/A;  . femoral rod placement Right   . TONSILLECTOMY      OB History    Gravida  2   Para  2   Term  2   Preterm  0   AB  0   Living  1     SAB  0   TAB  0   Ectopic  0   Multiple  0   Live Births  1            Home Medications    Prior to Admission medications   Medication Sig Start Date End Date Taking? Authorizing Provider  etonogestrel (NEXPLANON) 68 MG IMPL implant 68 mg. 09/22/12  Yes [provider]  busPIRone (BUSPAR) 15 MG tablet TAKE ONE TABLET TWICE DAILY AS NEEDED FOR ANXIETY 10/20/19   [provider]  ibuprofen (ADVIL,MOTRIN) 600 MG tablet Take 1 tablet (600 mg total) by mouth every 6 (six) hours as needed for fever, headache or mild pain. 08/05/15   Aletha Halim, MD  oxyCODONE-acetaminophen (PERCOCET/ROXICET) 5-325 MG per tablet Take 1-2  tablets by mouth every 6 (six) hours as needed for moderate pain or severe pain. 08/05/15   Aletha Halim, MD  Prenatal Vit-Fe Fumarate-FA (PRENATAL MULTIVITAMIN) TABS tablet Take 1 tablet by mouth daily at 12 noon.    [provider]  sertraline (ZOLOFT) 25 MG tablet TAKE ONE TABLET EVERY DAY FOR 2 WEEKS THEN INCREASE TO TAKE TWO TABLETS EVERY DAY IF TOLERATED 10/20/19   [provider]  simethicone (MYLICON) 80 MG chewable tablet Chew 1 tablet (80 mg total) by mouth 4 (four) times daily as needed for flatulence. 08/05/15   Aletha Halim, MD    Family History History reviewed. No pertinent family history.  Social History Social History   Tobacco Use  . Smoking status: Never Smoker  . Smokeless tobacco: Never Used  Substance Use Topics  . Alcohol use: No  . Drug use: No     Allergies   Patient has no known allergies.   Review of Systems Review of Systems  Constitutional: Negative for chills and fever.  HENT: Negative for ear pain and sore throat.   Eyes: Negative for pain and visual disturbance.  Respiratory: Negative for cough and shortness of breath.   Cardiovascular: Negative for chest pain and palpitations.  Gastrointestinal: Negative for abdominal pain,  diarrhea, nausea and vomiting.  Genitourinary: Positive for dysuria. Negative for flank pain, frequency, hematuria, pelvic pain and vaginal discharge.  Musculoskeletal: Negative for arthralgias and back pain.  Skin: Negative for color change and rash.  Neurological: Negative for seizures and syncope.  All other systems reviewed and are negative.    Physical Exam Triage Vital Signs ED Triage Vitals  Enc Vitals Group     BP      Pulse      Resp      Temp      Temp src      SpO2      Weight      Height      Head Circumference      Peak Flow      Pain Score      Pain Loc      Pain Edu?      Excl. in GC?    No data found.  Updated Vital Signs BP 119/79   Pulse 87   Temp 98.9 F  (37.2 C)   Resp 18   Wt 140 lb (63.5 kg)   LMP 10/26/2019   SpO2 98%   BMI 24.03 kg/m   Visual Acuity Right Eye Distance:   Left Eye Distance:   Bilateral Distance:    Right Eye Near:   Left Eye Near:    Bilateral Near:     Physical Exam Vitals and nursing note reviewed.  Constitutional:      General: She is not in acute distress.    Appearance: She is well-developed. She is not ill-appearing.  HENT:     Head: Normocephalic and atraumatic.     Mouth/Throat:     Mouth: Mucous membranes are moist.     Pharynx: Oropharynx is clear.  Eyes:     Conjunctiva/sclera: Conjunctivae normal.  Cardiovascular:     Rate and Rhythm: Normal rate and regular rhythm.     Heart sounds: No murmur.  Pulmonary:     Effort: Pulmonary effort is normal. No respiratory distress.     Breath sounds: Normal breath sounds.  Abdominal:     General: Bowel sounds are normal.     Palpations: Abdomen is soft.     Tenderness: There is no abdominal tenderness. There is no right CVA tenderness, left CVA tenderness, guarding or rebound.  Musculoskeletal:     Cervical back: Neck supple.  Skin:    General: Skin is warm and dry.     Findings: No rash.  Neurological:     General: No focal deficit present.     Mental Status: She is alert and oriented to person, place, and time.  Psychiatric:        Mood and Affect: Mood normal.        Behavior: Behavior normal.      UC Treatments / Results  Labs (all labs ordered are listed, but only abnormal results are displayed) Labs Reviewed  POCT URINALYSIS DIP (MANUAL ENTRY) - Abnormal; Notable for the following components:      Result Value   Blood, UA moderate (*)    All other components within normal limits  URINE CULTURE    EKG   Radiology No results found.  Procedures Procedures (including critical care time)  Medications Ordered in UC Medications - No data to display  Initial Impression / Assessment and Plan / UC Course  I have reviewed  the triage vital signs and the nursing notes.  Pertinent labs & imaging results that were  available during my care of the patient were reviewed by me and considered in my medical decision making (see chart for details).    Dysuria.  Patient is well-appearing and her exam is unremarkable.  Urine culture pending.  Discussed with patient that we will call her if her urine culture shows infection requiring treatment.  Discussed with her that the blood in her urine may be residual caused by her menstrual cycle as patient reports she is on the last day of her cycle.  Instructed patient to follow-up with her PCP if her symptoms are not improving.  Patient agrees to this plan of care.     Final Clinical Impressions(s) / UC Diagnoses   Final diagnoses:  Dysuria     Discharge Instructions     Your urine does not show signs of an infection today but does have some blood.  This may be caused by residual blood from your menstrual cycle.  A urine culture is being sent and we will call you if the results are positive requiring treatment.    Follow-up with your primary care provider if your symptoms are not improving.        ED Prescriptions    None     PDMP not reviewed this encounter.   Mickie Bailate, Jayonna Meyering H, NP 11/03/19 1048

## 2019-11-03 NOTE — ED Triage Notes (Signed)
Patient in office today c/o painful urination and sensation afterward sx x 1d   OTC: none LMP: 10/26/2019

## 2019-11-03 NOTE — Discharge Instructions (Addendum)
Your urine does not show signs of an infection today but does have some blood.  This may be caused by residual blood from your menstrual cycle.  A urine culture is being sent and we will call you if the results are positive requiring treatment.    Follow-up with your primary care provider if your symptoms are not improving.

## 2019-11-05 LAB — URINE CULTURE: Culture: 20000 — AB

## 2020-10-20 ENCOUNTER — Encounter: Payer: Self-pay | Admitting: Obstetrics and Gynecology

## 2021-05-02 ENCOUNTER — Other Ambulatory Visit: Payer: Self-pay

## 2021-05-02 ENCOUNTER — Ambulatory Visit (LOCAL_COMMUNITY_HEALTH_CENTER): Payer: Self-pay

## 2021-05-02 VITALS — BP 115/66 | Ht 64.0 in | Wt 154.5 lb

## 2021-05-02 DIAGNOSIS — Z3201 Encounter for pregnancy test, result positive: Secondary | ICD-10-CM

## 2021-05-02 LAB — PREGNANCY, URINE: Preg Test, Ur: POSITIVE — AB

## 2021-05-02 MED ORDER — PRENATAL 27-0.8 MG PO TABS: 1.0000 | ORAL_TABLET | Freq: Every day | ORAL | 0 refills | Status: AC

## 2021-05-02 NOTE — Progress Notes (Signed)
UPT positive. Tentative prenatal care planned at [facility]. To DSS for Medicaid/Preg Women.  Local prenatal provider resource list given. Jerel Shepherd, RN

## 2021-05-07 ENCOUNTER — Other Ambulatory Visit: Payer: Self-pay

## 2021-05-07 ENCOUNTER — Emergency Department: Payer: Self-pay

## 2021-05-07 ENCOUNTER — Encounter: Payer: Self-pay | Admitting: Emergency Medicine

## 2021-05-07 ENCOUNTER — Emergency Department
Admission: EM | Admit: 2021-05-07 | Discharge: 2021-05-07 | Disposition: A | Discharge status: Home / Self Care | Payer: Self-pay | Attending: Emergency Medicine | Admitting: Emergency Medicine

## 2021-05-07 DIAGNOSIS — O26851 Spotting complicating pregnancy, first trimester: Secondary | ICD-10-CM | POA: Insufficient documentation

## 2021-05-07 DIAGNOSIS — O209 Hemorrhage in early pregnancy, unspecified: Secondary | ICD-10-CM

## 2021-05-07 DIAGNOSIS — R109 Unspecified abdominal pain: Secondary | ICD-10-CM | POA: Insufficient documentation

## 2021-05-07 DIAGNOSIS — N939 Abnormal uterine and vaginal bleeding, unspecified: Secondary | ICD-10-CM | POA: Insufficient documentation

## 2021-05-07 DIAGNOSIS — Z3A01 Less than 8 weeks gestation of pregnancy: Secondary | ICD-10-CM | POA: Insufficient documentation

## 2021-05-07 DIAGNOSIS — O200 Threatened abortion: Secondary | ICD-10-CM | POA: Insufficient documentation

## 2021-05-07 LAB — CBC WITH DIFFERENTIAL/PLATELET
Abs Immature Granulocytes: 0.01 10*3/uL (ref 0.00–0.07)
Basophils Absolute: 0.1 10*3/uL (ref 0.0–0.1)
Basophils Relative: 1 %
Eosinophils Absolute: 0.1 10*3/uL (ref 0.0–0.5)
Eosinophils Relative: 2 %
HCT: 40.3 % (ref 36.0–46.0)
Hemoglobin: 13.6 g/dL (ref 12.0–15.0)
Immature Granulocytes: 0 %
Lymphocytes Relative: 23 %
Lymphs Abs: 1.6 10*3/uL (ref 0.7–4.0)
MCH: 29 pg (ref 26.0–34.0)
MCHC: 33.7 g/dL (ref 30.0–36.0)
MCV: 85.9 fL (ref 80.0–100.0)
Monocytes Absolute: 0.6 10*3/uL (ref 0.1–1.0)
Monocytes Relative: 9 %
Neutro Abs: 4.6 10*3/uL (ref 1.7–7.7)
Neutrophils Relative %: 65 %
Platelets: 300 10*3/uL (ref 150–400)
RBC: 4.69 MIL/uL (ref 3.87–5.11)
RDW: 12.7 % (ref 11.5–15.5)
WBC: 7 10*3/uL (ref 4.0–10.5)
nRBC: 0 % (ref 0.0–0.2)

## 2021-05-07 LAB — URINALYSIS, COMPLETE (UACMP) WITH MICROSCOPIC
Bacteria, UA: NONE SEEN
Bilirubin Urine: NEGATIVE
Glucose, UA: NEGATIVE mg/dL
Ketones, ur: NEGATIVE mg/dL
Leukocytes,Ua: NEGATIVE
Nitrite: NEGATIVE
Protein, ur: NEGATIVE mg/dL
Specific Gravity, Urine: 1.021 (ref 1.005–1.030)
Squamous Epithelial / LPF: NONE SEEN (ref 0–5)
pH: 6 (ref 5.0–8.0)

## 2021-05-07 LAB — BASIC METABOLIC PANEL
Anion gap: 7 (ref 5–15)
BUN: 14 mg/dL (ref 6–20)
CO2: 25 mmol/L (ref 22–32)
Calcium: 8.8 mg/dL — ABNORMAL LOW (ref 8.9–10.3)
Chloride: 108 mmol/L (ref 98–111)
Creatinine, Ser: 0.53 mg/dL (ref 0.44–1.00)
GFR, Estimated: >60 mL/min (ref >60)
Glucose, Bld: 84 mg/dL (ref 70–99)
Potassium: 3.8 mmol/L (ref 3.5–5.1)
Sodium: 140 mmol/L (ref 135–145)

## 2021-05-07 LAB — ABO/RH: ABO/RH(D): O POS

## 2021-05-07 LAB — HCG, QUANTITATIVE, PREGNANCY: hCG, Beta Chain, Quant, S: 340 m[IU]/mL — ABNORMAL HIGH (ref <5)

## 2021-05-07 NOTE — ED Triage Notes (Signed)
Pt reports is [redacted] weeks pregnant and started with heavy vaginal bleeding today and some abd pain

## 2021-05-07 NOTE — ED Notes (Signed)
See triage note  Presents with abd cramping and vaginal bleeding  States she noticed light bleeding when wiping yesterday  This am noticed that bleeding was heavier  No clots  Pt is 6 weeks preg

## 2021-05-07 NOTE — ED Provider Notes (Signed)
[facility] Emergency Department Provider Note  ____________________________________________   Event Date/Time   First MD Initiated Contact with Patient 05/07/21 [phone]     (approximate)  I have reviewed the triage vital signs and the nursing notes.   HISTORY  Chief Complaint Vaginal Bleeding and Abdominal Pain   HPI Tammy [name] is a 35 y.o. female presents to the ED with complaint of vaginal bleeding.  Patient states that she was told she was approximately [redacted] weeks pregnant.  Patient denies any abdominal pain but states that she had some light spotting yesterday with wiping during urination.  She states today she began using a pad as she saw more bleeding without clots.  She also is experiencing some abdominal pain.  She reports her pain is 1 out of 10.         History reviewed. No pertinent past medical history.  Patient Active Problem List   Diagnosis Date Noted   High-risk pregnancy 08/02/2015   History of cesarean delivery 08/02/2015   Malpresentation of fetus, antepartum 08/02/2015   S/P cesarean section 08/02/2015    Past Surgical History:  Procedure Laterality Date   BREAST ENHANCEMENT SURGERY Bilateral    BREAST SURGERY     CESAREAN SECTION     CESAREAN SECTION N/A 08/02/2015   Procedure: CESAREAN SECTION;  Surgeon: Conard Novak, MD;  Location: [facility] ORS;  Service: Obstetrics;  Laterality: N/A;   femoral rod placement Right    TONSILLECTOMY      Prior to Admission medications   Medication Sig Start Date End Date Taking? Authorizing Provider  busPIRone (BUSPAR) 15 MG tablet TAKE ONE TABLET TWICE DAILY AS NEEDED FOR ANXIETY Patient not taking: Reported on 05/02/2021 10/20/19   [provider]  etonogestrel (NEXPLANON) 68 MG IMPL implant 68 mg. Patient not taking: Reported on 05/02/2021 09/22/12   [provider]  ibuprofen (ADVIL,MOTRIN) 600 MG tablet Take 1 tablet (600 mg total) by mouth every 6 (six) hours as  needed for fever, headache or mild pain. Patient not taking: Reported on 05/02/2021 08/05/15   [place] Bing, MD  oxyCODONE-acetaminophen (PERCOCET/ROXICET) 5-325 MG per tablet Take 1-2 tablets by mouth every 6 (six) hours as needed for moderate pain or severe pain. Patient not taking: Reported on 05/02/2021 08/05/15   [place] Bing, MD  Prenatal Vit-Fe Fumarate-FA (MULTIVITAMIN-PRENATAL) 27-0.8 MG TABS tablet Take 1 tablet by mouth daily at 12 noon. 05/02/21 08/10/21  Federico Flake, MD  Prenatal Vit-Fe Fumarate-FA (PRENATAL MULTIVITAMIN) TABS tablet Take 1 tablet by mouth daily at 12 noon.    [provider]  sertraline (ZOLOFT) 25 MG tablet TAKE ONE TABLET EVERY DAY FOR 2 WEEKS THEN INCREASE TO TAKE TWO TABLETS EVERY DAY IF TOLERATED Patient not taking: Reported on 05/02/2021 10/20/19   [provider]  simethicone (MYLICON) 80 MG chewable tablet Chew 1 tablet (80 mg total) by mouth 4 (four) times daily as needed for flatulence. Patient not taking: Reported on 05/02/2021 08/05/15   [place] Bing, MD    Allergies Patient has no known allergies.  No family history on file.  Social History Social History   Tobacco Use   Smoking status: Never   Smokeless tobacco: Never  Vaping Use   Vaping Use: Never used  Substance Use Topics   Alcohol use: Not Currently    Comment: last use - 71mo ago   Drug use: No    Review of Systems Constitutional: No fever/chills Eyes: No visual changes. ENT: No sore  throat. Cardiovascular: Denies chest pain. Respiratory: Denies shortness of breath. Gastrointestinal: Mild abdominal pain.  No nausea, no vomiting.  Genitourinary: Negative for dysuria.  Positive for vaginal bleeding. Musculoskeletal: Negative for musculoskeletal pain. Skin: Negative for rash. Neurological: Negative for headaches, focal weakness or numbness.  ____________________________________________   PHYSICAL EXAM:  VITAL SIGNS: ED Triage Vitals  Enc  Vitals Group     BP 05/07/21 0851 119/78     Pulse Rate 05/07/21 0851 91     Resp 05/07/21 0851 16     Temp 05/07/21 0851 98.6 F (37 C)     Temp Source 05/07/21 0851 Oral     SpO2 05/07/21 0851 100 %     Weight 05/07/21 0854 154 lb 8 oz (70.1 kg)     Height 05/07/21 0854 5\' 4"  (1.626 m)     Head Circumference --      Peak Flow --      Pain Score 05/07/21 0848 1     Pain Loc --      Pain Edu? --      Excl. in GC? --     Constitutional: Alert and oriented. Well appearing and in no acute distress. Eyes: Conjunctivae are normal. PERRL. EOMI. Head: Atraumatic. Neck: No stridor.   Cardiovascular: Normal rate, regular rhythm. Grossly normal heart sounds.  Good peripheral circulation. Respiratory: Normal respiratory effort.  No retractions. Lungs CTAB. Gastrointestinal: Soft and nontender. No distention.  Bowel sounds normoactive x4 quadrants.  No CVA tenderness. Musculoskeletal: Moves both upper and lower extremities they have difficulty.  Normal gait was noted. Neurologic:  Normal speech and language. No gross focal neurologic deficits are appreciated.  Skin:  Skin is warm, dry and intact. No rash noted. Psychiatric: Mood and affect are normal. Speech and behavior are normal.  ____________________________________________   LABS (all labs ordered are listed, but only abnormal results are displayed)  Labs Reviewed  URINALYSIS, COMPLETE (UACMP) WITH MICROSCOPIC - Abnormal; Notable for the following components:      Result Value   Color, Urine YELLOW (*)    APPearance CLEAR (*)    Hgb urine dipstick MODERATE (*)    All other components within normal limits  BASIC METABOLIC PANEL - Abnormal; Notable for the following components:   Calcium 8.8 (*)    All other components within normal limits  HCG, QUANTITATIVE, PREGNANCY - Abnormal; Notable for the following components:   hCG, Beta Chain, Quant, S 340 (*)    All other components within normal limits  CBC WITH  DIFFERENTIAL/PLATELET  ABO/RH   ____________________________________________  ___________________________________________  RADIOLOGY 05/09/21, personally viewed and evaluated these images (plain radiographs) as part of my medical decision making, as well as reviewing the written report by the radiologist.    Official radiology report(s): Beaulah Corin OB LESS THAN 14 WEEKS WITH OB TRANSVAGINAL  Result Date: 05/07/2021 CLINICAL DATA:  Patient [redacted] weeks pregnant based on her last menstrual period. Heavy vaginal bleeding and abdominal cramping. Beta HCG level 340. EXAM: OBSTETRIC <14 WK 05/09/2021 AND TRANSVAGINAL OB US TECHNIQUE: Both transabdominal and transvaginal ultrasound examinations were performed for complete evaluation of the gestation as well as the maternal uterus, adnexal regions, and pelvic cul-de-sac. Transvaginal technique was performed to assess early pregnancy. COMPARISON:  None. FINDINGS: Intrauterine gestational sac: Small irregular gestational sac in the upper uterine segment. Yolk sac:  Not Visualized. Embryo:  Not Visualized. MSD:  4 mm   5 w   1 d Subchorionic hemorrhage:  None visualized. Maternal  uterus/adnexae: No uterine masses. No endometrial fluid. Cervix is closed. Normal ovaries with a right corpus luteum. Prominent vessels noted adjacent to the uterus, greater on the left. No adnexal masses and no pelvic free fluid. IMPRESSION: 1. Irregular intrauterine gestational sac consistent, which may reflect an early pregnancy, but shape concerning for a failed intrauterine pregnancy. No embryo or yolk sac. Recommend follow-up serial beta HCG levels and repeat pelvic ultrasound in 10-14 days. Electronically Signed   By: Amie Portland M.D.   On: 05/07/2021 12:53    ____________________________________________   PROCEDURES  Procedure(s) performed (including Critical Care):  Procedures   ____________________________________________   INITIAL IMPRESSION / ASSESSMENT AND PLAN / ED  COURSE  As part of my medical decision making, I reviewed the following data within the electronic MEDICAL RECORD NUMBER Notes from prior ED visits and [place] Controlled Substance Database  35 year old female presents to the ED with complaint of light vaginal spotting with increased bleeding today and cramping.  Patient was on the impression she was possibly [redacted] weeks pregnant however her beta-hCG does not reflect this.  But due to hCG was 340.  ABO/Rh was O+.  Transvaginal ultrasound per radiologist shows irregular gestational sac which is concerning for a failed IUP.  I discussed this possibility with the patient but also that she needs to follow-up with her OB/GYN or primary care for repeat beta-hCG and ultrasound in 10 to 14 days.  Patient states that her PCP also provides prenatal care and she will make an appointment for this.  She is to return to the emergency department if any severe worsening of her symptoms and is aware that a threatened miscarriage may be the end result.   ____________________________________________   FINAL CLINICAL IMPRESSION(S) / ED DIAGNOSES  Final diagnoses:  Threatened miscarriage     ED Discharge Orders     None        Note:  This document was prepared using Dragon voice recognition software and may include unintentional dictation errors.    Tommi Rumps, PA-C 05/07/21 1648    Sharyn Creamer, MD 05/08/21 Zollie Pee

## 2021-05-07 NOTE — Discharge Instructions (Addendum)
Call your primary care provider tomorrow to arrange for a repeat beta-hCG blood test and an ultrasound in 10 to 14 days as recommended by the radiologist.  You may take Tylenol if needed for abdominal cramping.  Return to the emergency department if any severe worsening of your symptoms.

## 2022-03-08 ENCOUNTER — Other Ambulatory Visit: Payer: Self-pay | Admitting: Family Medicine

## 2022-03-08 DIAGNOSIS — Z3480 Encounter for supervision of other normal pregnancy, unspecified trimester: Secondary | ICD-10-CM

## 2022-03-16 ENCOUNTER — Ambulatory Visit
Admission: RE | Admit: 2022-03-16 | Discharge: 2022-03-16 | Disposition: A | Discharge status: Home / Self Care | Payer: Self-pay | Source: Ambulatory Visit | Attending: Family Medicine | Admitting: Family Medicine

## 2022-03-16 DIAGNOSIS — Z3689 Encounter for other specified antenatal screening: Secondary | ICD-10-CM | POA: Insufficient documentation

## 2022-03-16 DIAGNOSIS — Z3A19 19 weeks gestation of pregnancy: Secondary | ICD-10-CM | POA: Insufficient documentation

## 2022-03-16 DIAGNOSIS — Z3480 Encounter for supervision of other normal pregnancy, unspecified trimester: Secondary | ICD-10-CM

## 2022-06-19 IMAGING — US US OB < 14 WEEKS - US OB TV
1 series · 13 of 28 positions shown · non-contrast
Comparison: None.

CLINICAL DATA: Patient 6 weeks pregnant based on her last menstrual
period. Heavy vaginal bleeding and abdominal cramping. Beta HCG
level 340.

EXAM:
OBSTETRIC <14 WK US AND TRANSVAGINAL OB US
TECHNIQUE: Both transabdominal and transvaginal ultrasound examinations were
performed for complete evaluation of the gestation as well as the
maternal uterus, adnexal regions, and pelvic cul-de-sac.
Transvaginal technique was performed to assess early pregnancy.

[Series 1: us ob comp less 14 wks · 13 of 87 slices shown]
[im 4/87]
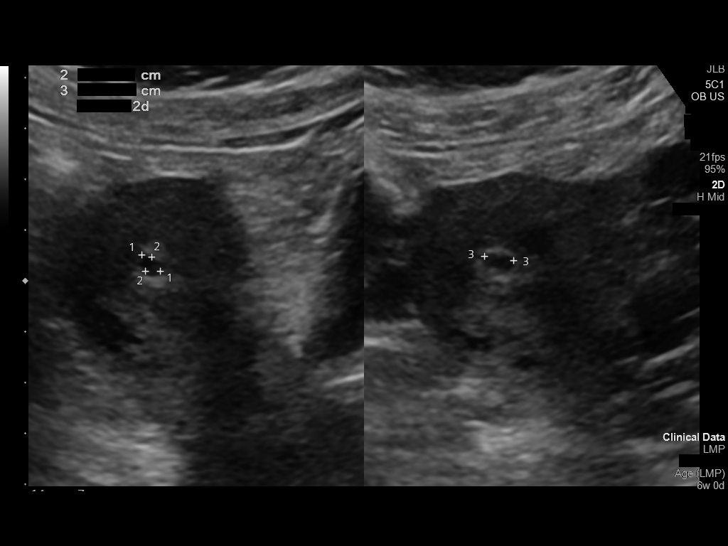
[im 10/87]
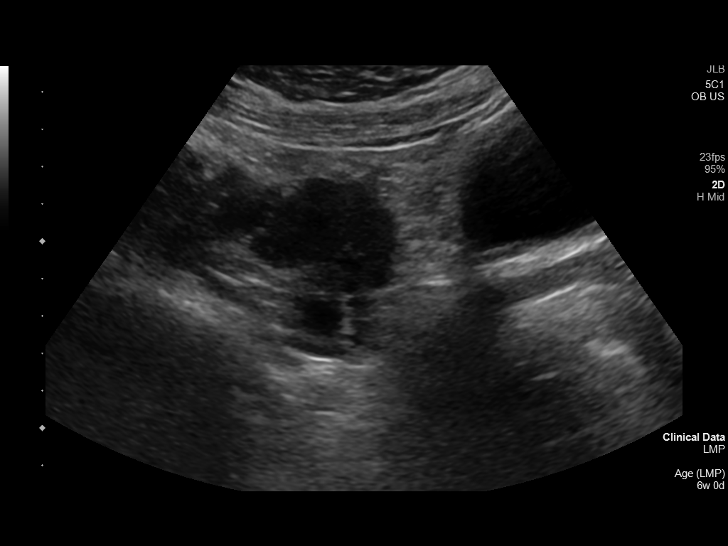
[im 16/87]
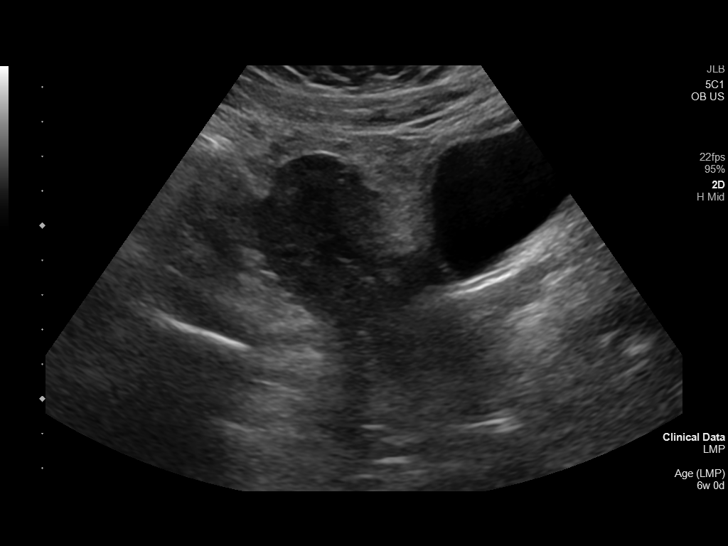
[im 23/87]
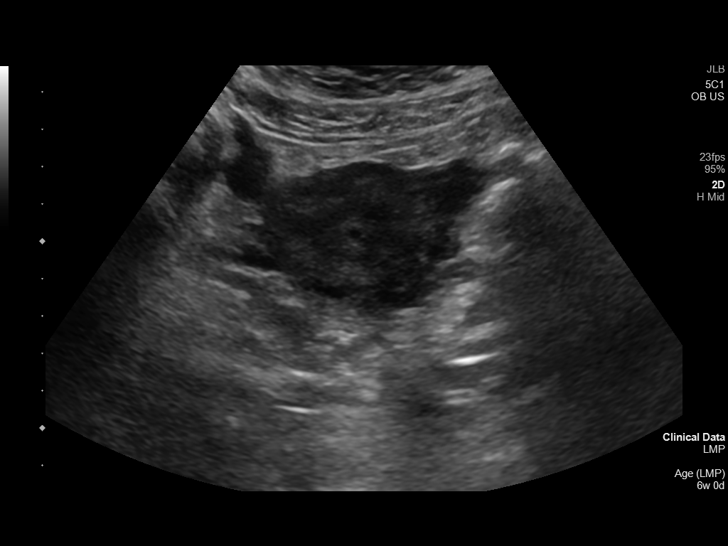
[im 29/87]
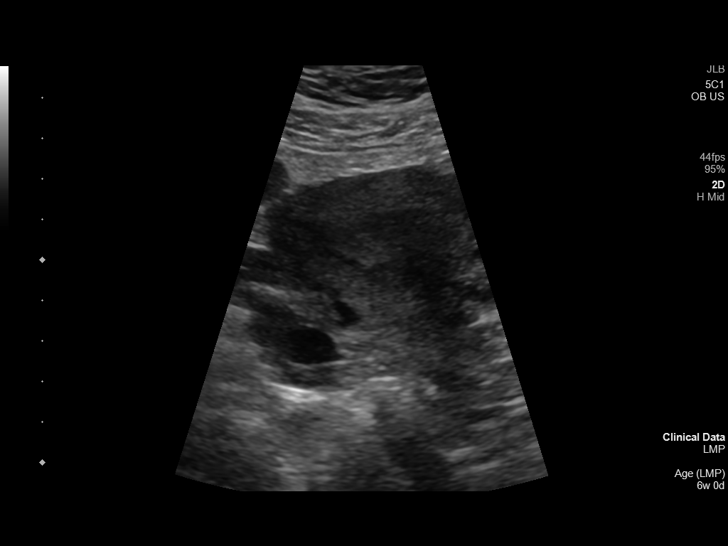
[im 36/87]
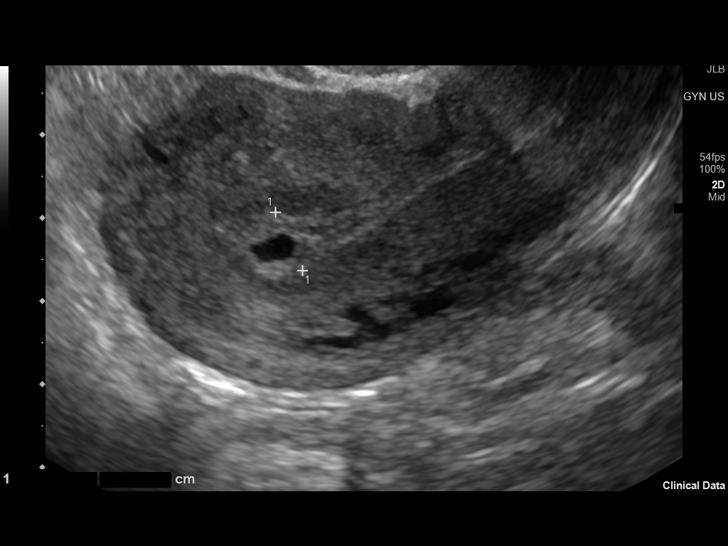
[im 45/87]
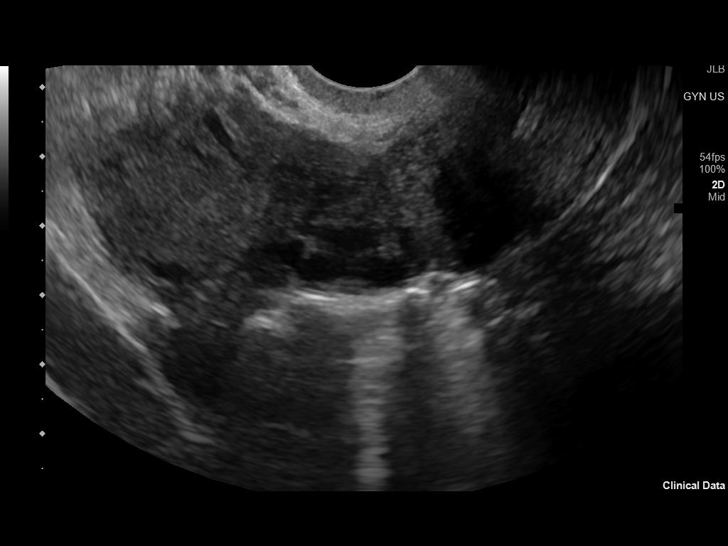
[im 51/87]
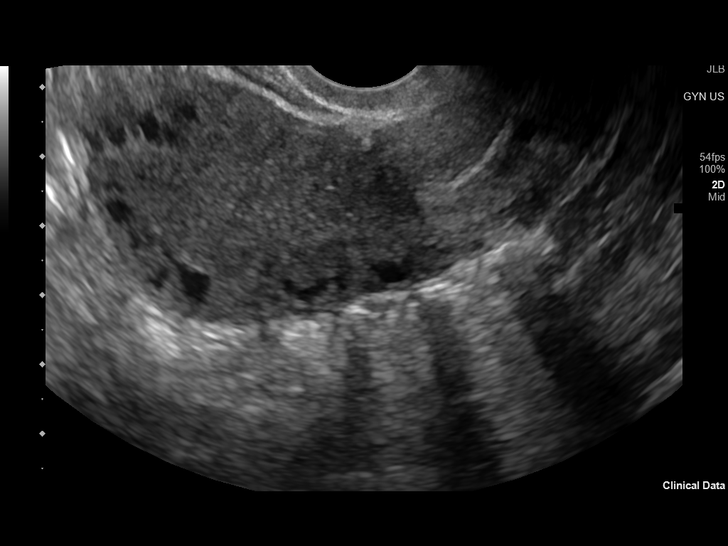
[im 58/87]
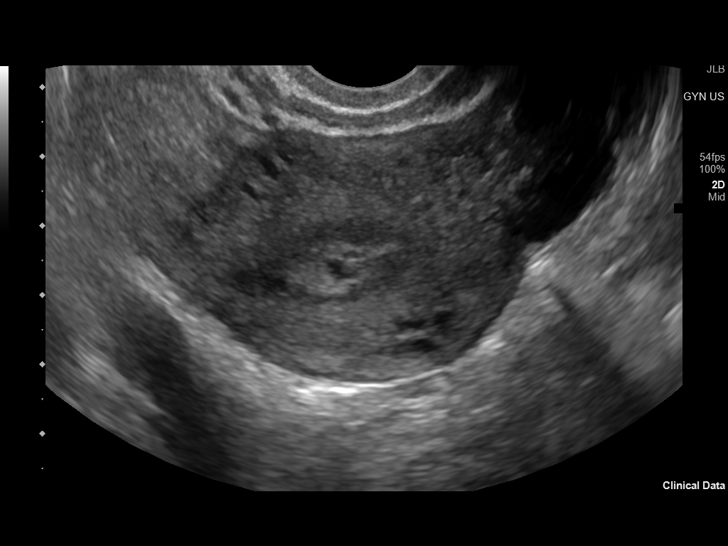
[im 64/87]
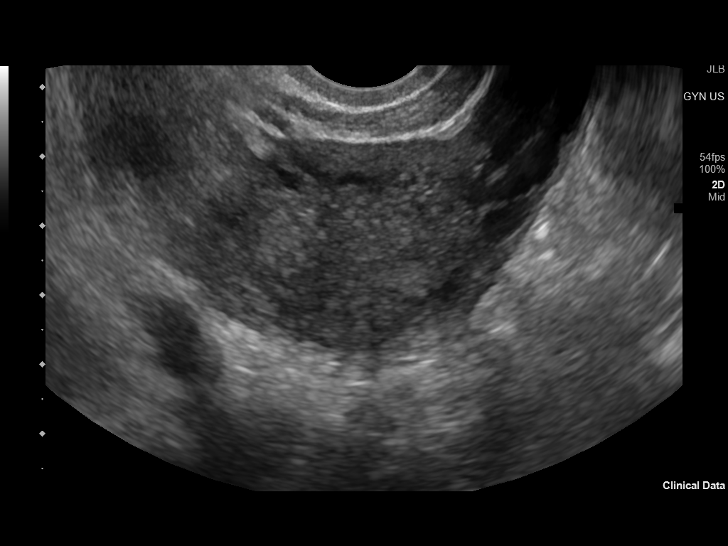
[im 71/87]
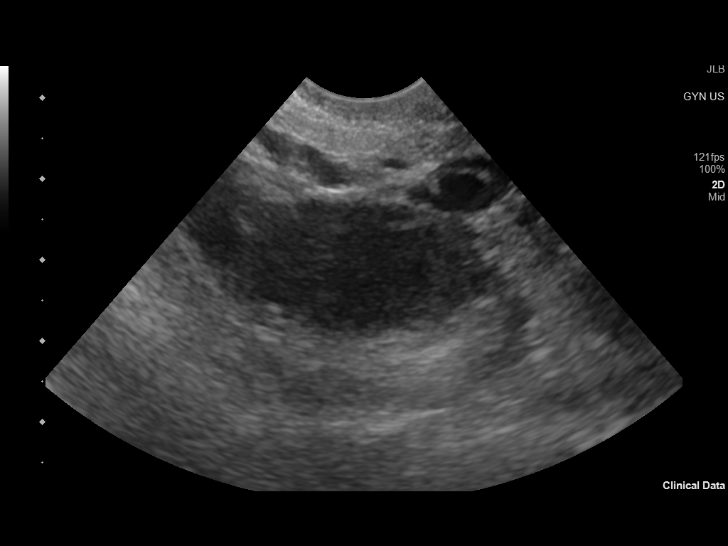
[im 77/87]
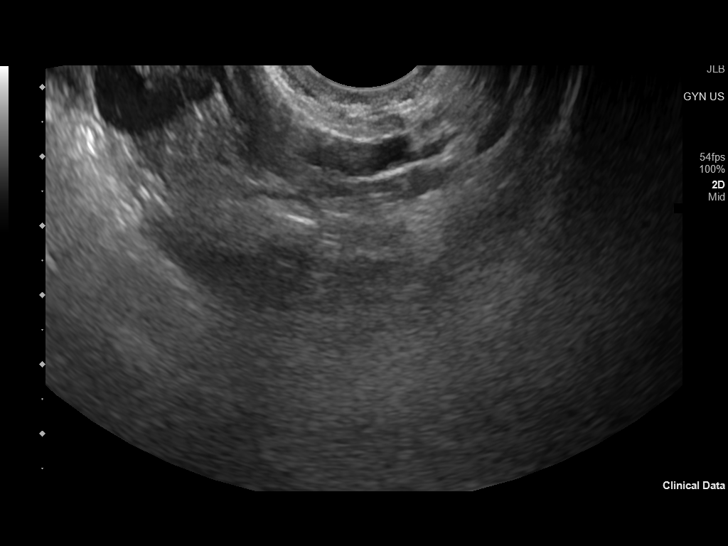
[im 83/87]
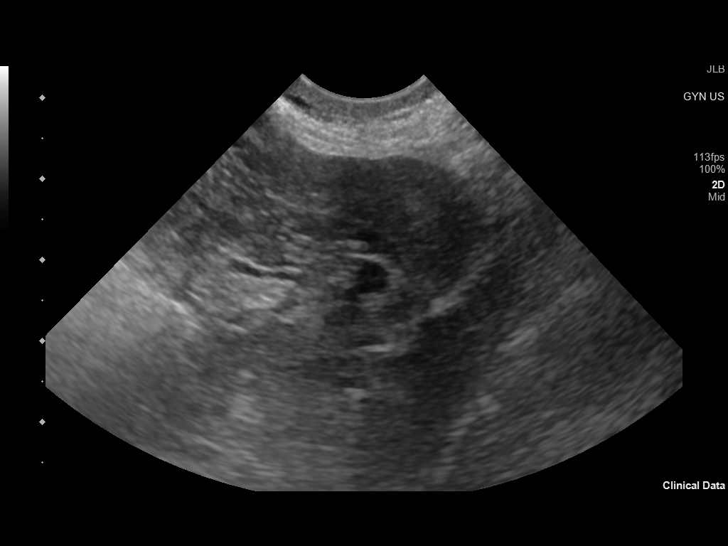

[13 of 28 positions shown; findings below may reference images not displayed]

FINDINGS: Intrauterine gestational sac: Small irregular gestational sac in the
upper uterine segment.

Yolk sac:  Not Visualized.

Embryo:  Not Visualized.

MSD:  4 mm   5 w   1 d

Subchorionic hemorrhage:  None visualized.

Maternal uterus/adnexae: No uterine masses. No endometrial fluid.
Cervix is closed. Normal ovaries with a right corpus luteum.
Prominent vessels noted adjacent to the uterus, greater on the left.
No adnexal masses and no pelvic free fluid.
IMPRESSION: 1. Irregular intrauterine gestational sac consistent, which may
reflect an early pregnancy, but shape concerning for a failed
intrauterine pregnancy. No embryo or yolk sac. Recommend follow-up
serial beta HCG levels and repeat pelvic ultrasound in 10-14 days.

## 2023-04-28 IMAGING — US US OB COMP +14 WK
1 series · 13 of 28 positions shown · non-contrast
Comparison: none

CLINICAL DATA: Fetal anatomy evaluation

EXAM:
OBSTETRICAL ULTRASOUND >14 WKS

[Series 1: us ob comp +14 wk · 0.17mm/px · 13 of 85 slices shown]
[im 4/85]
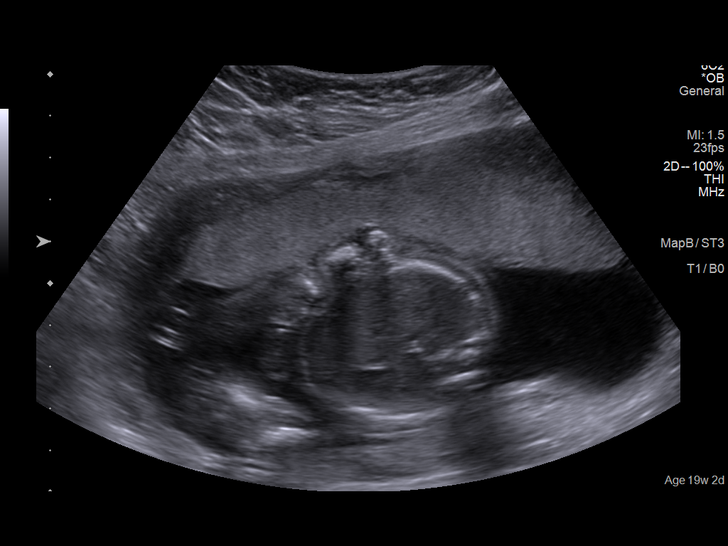
[im 10/85]
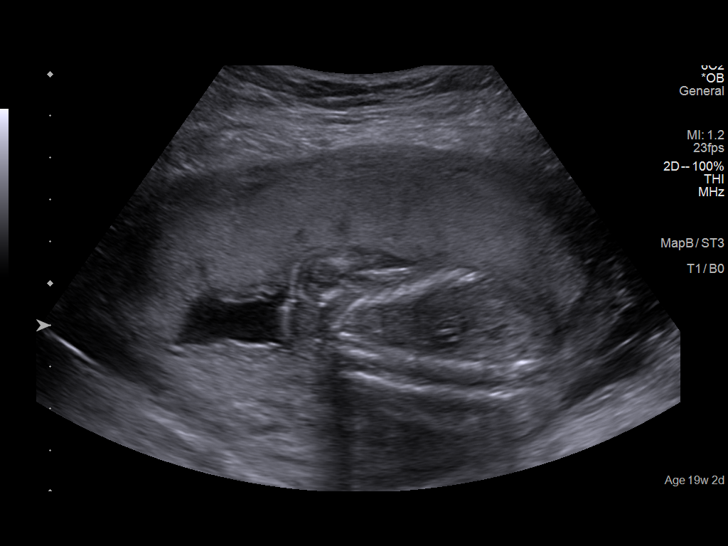
[im 16/85]
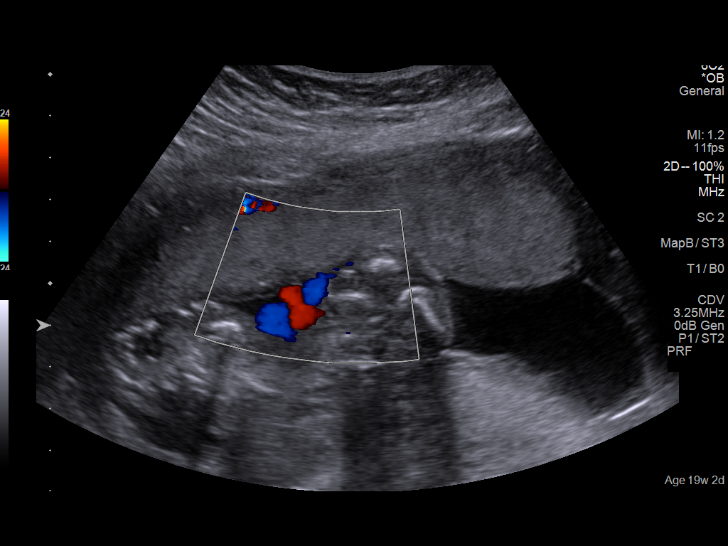
[im 22/85]
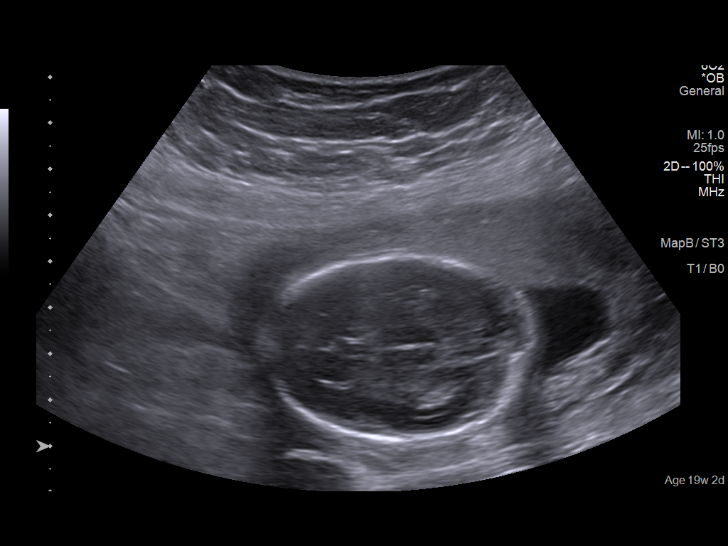
[im 29/85]
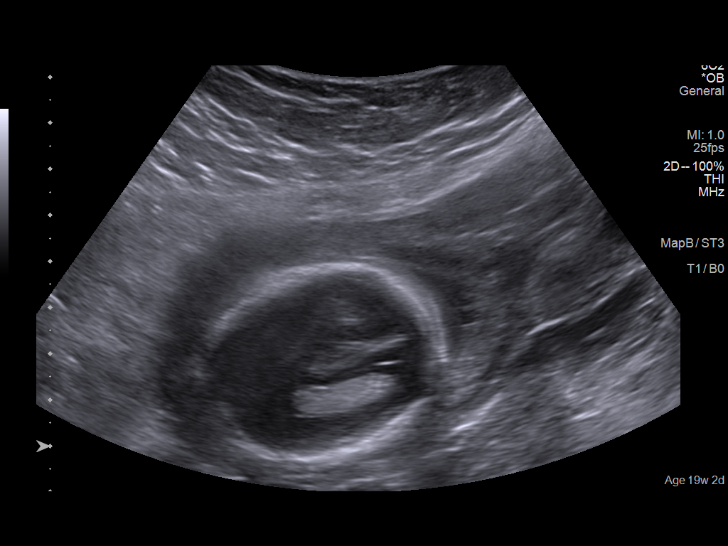
[im 35/85]
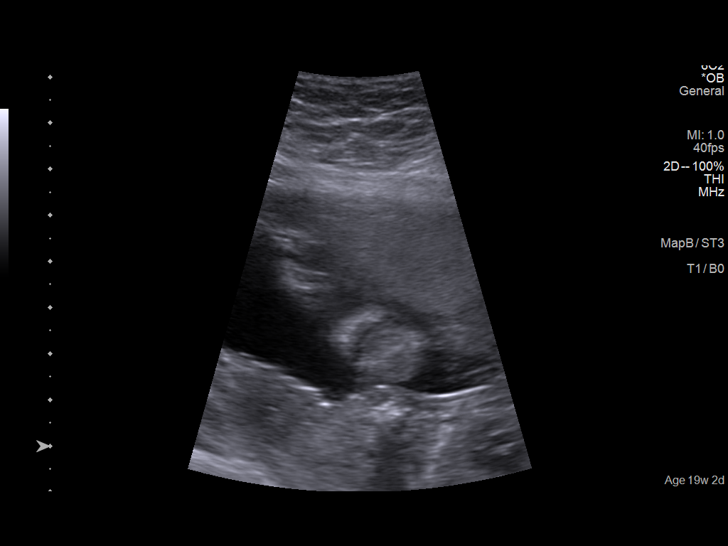
[im 44/85]
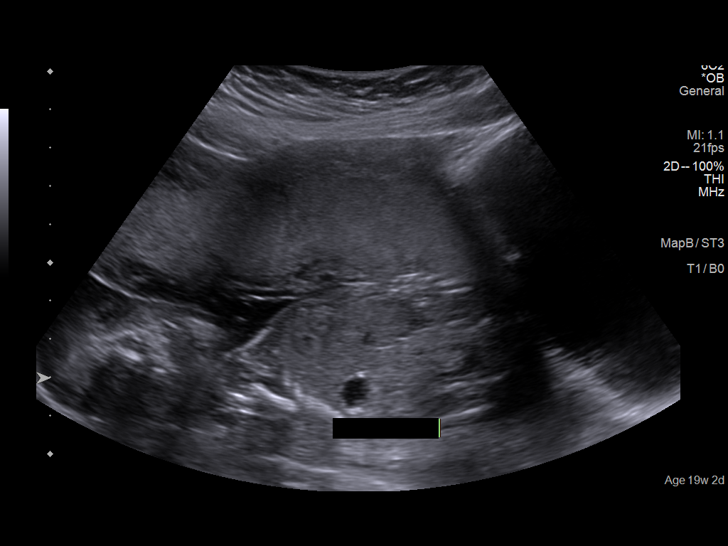
[im 50/85]
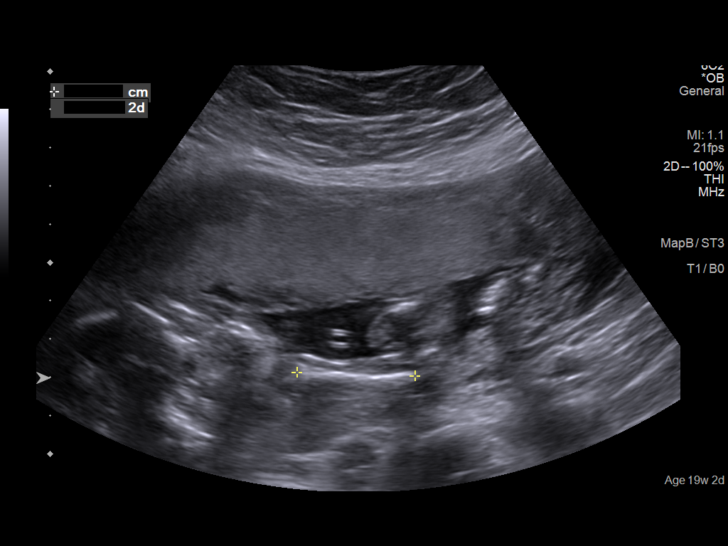
[im 57/85]
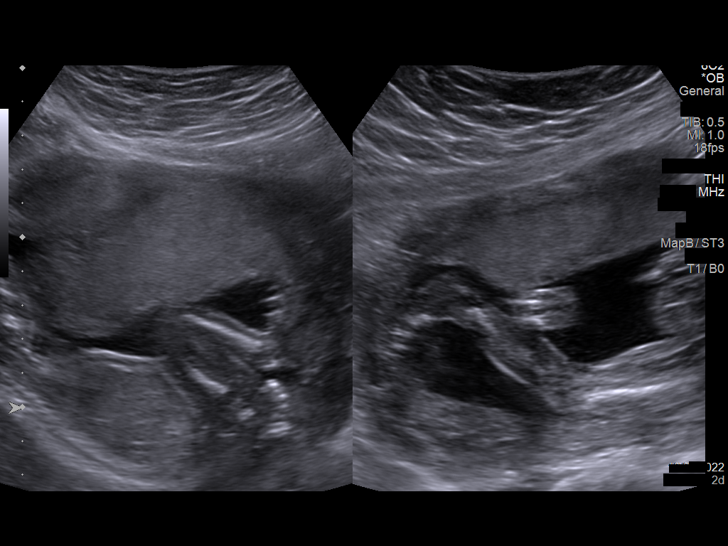
[im 63/85]
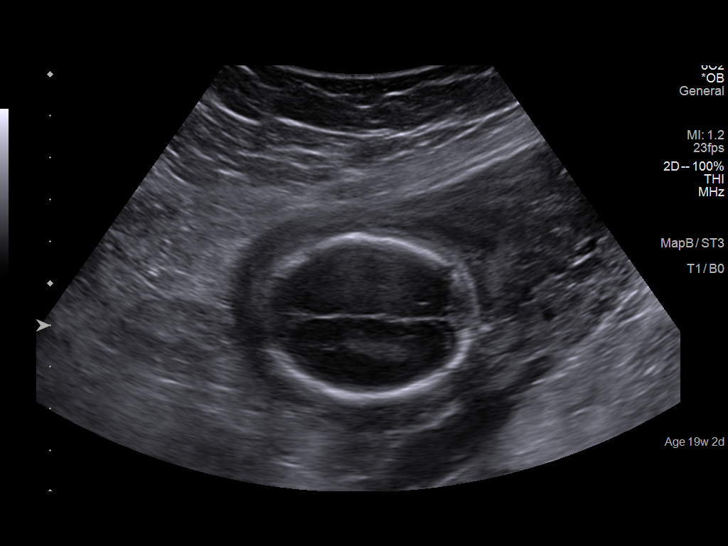
[im 69/85]
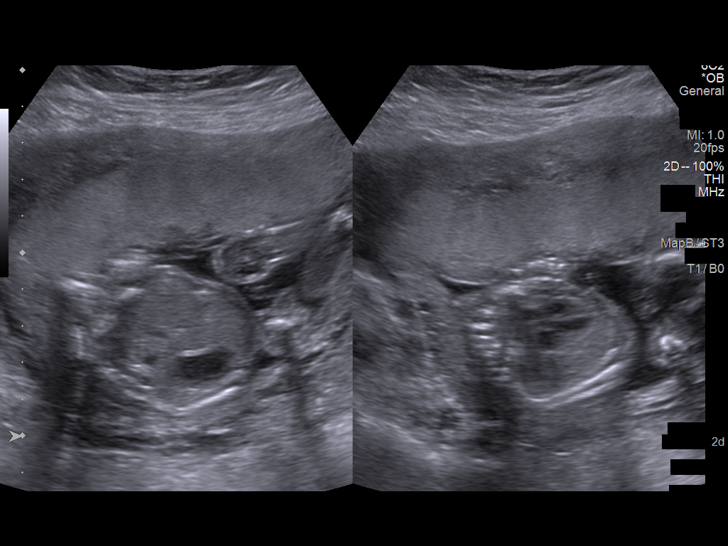
[im 75/85]
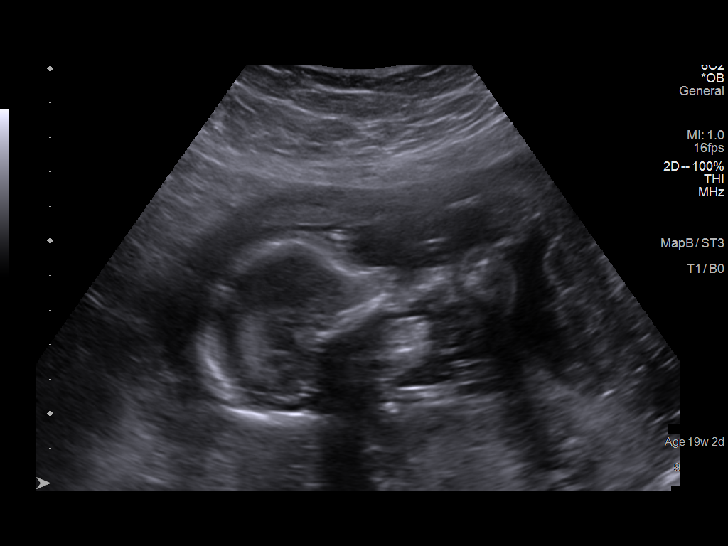
[im 81/85]
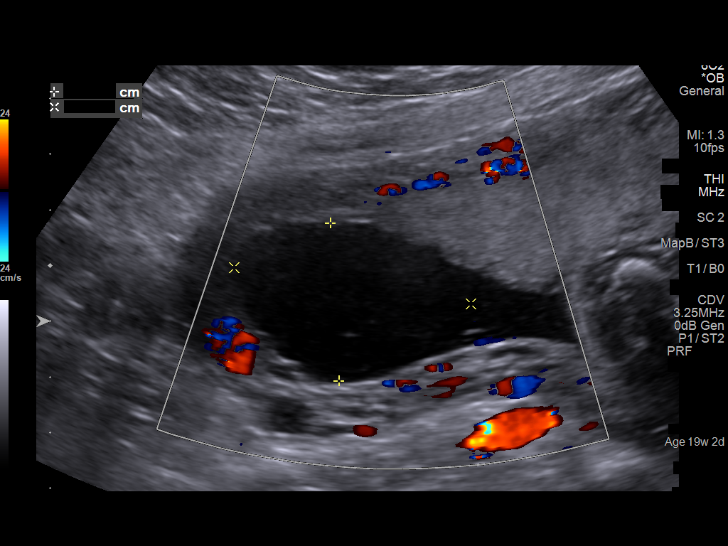

[13 of 28 positions shown; findings below may reference images not displayed]

FINDINGS: Number of Fetuses: 1

Heart Rate:  160 bpm

Movement: Yes

Presentation: Variable

Previa: No

Placental Location: Anterior

Amniotic Fluid (Subjective): Normal

Amniotic Fluid (Objective):

Vertical pocket = 2.8cm

FETAL BIOMETRY

BPD: 3.89cm 17w 6d

HC:   15.82cm 18w 5d

AC:   14.25cm 19w 4d

FL:   3cm 19w 2d

Current Mean GA: 19w 1d US EDC: 08/09/2022

Assigned GA:  19w 2d Assigned EDC: 08/08/2022

FETAL ANATOMY

Lateral Ventricles: Appears normal

Thalami/CSP: Appears normal

Posterior Fossa:  Appears normal

Nuchal Region: Appears normal   NFT= N/A > 20 WKS

Upper Lip: Appears normal

Spine: Not visualized

4 Chamber Heart on Left: Not visualized

LVOT: Previously seen

RVOT: Previously seen

Stomach on Left: Appears normal

3 Vessel Cord: Appears normal

Cord Insertion site: Appears normal

Kidneys: Appears normal

Bladder: Appears normal

Extremities: Appears normal

Other: Incidental note made of relatively echogenic bowel.

Technically difficult due to: Fetal position

Maternal Findings:

Cervix: 4.6 cm. Closed.
IMPRESSION: 1. Single live intrauterine pregnancy as detailed above.
2. Fetal spine, heart, LVOT and RVOT are not well visualized.
Recommend follow-up limited ultrasound for re-evaluation.
3. Echogenic bowel noted, which is a soft sonographic marker for
aneuploidy and also has association with cystic fibrosis, congenital
infection, and IUGR. Recommend continued follow-up by ultrasound and
consider MFM/genetic counseling consultation.

## 2023-12-29 ENCOUNTER — Ambulatory Visit
Admission: EM | Admit: 2023-12-29 | Discharge: 2023-12-29 | Disposition: A | Discharge status: Home / Self Care | Payer: Self-pay | Attending: Emergency Medicine | Admitting: Emergency Medicine

## 2023-12-29 ENCOUNTER — Encounter: Payer: Self-pay | Admitting: *Deleted

## 2023-12-29 DIAGNOSIS — B349 Viral infection, unspecified: Secondary | ICD-10-CM

## 2023-12-29 HISTORY — DX: Viral infection, unspecified: B34.9

## 2023-12-29 LAB — POCT RAPID STREP A (OFFICE): Rapid Strep A Screen: NEGATIVE

## 2023-12-29 NOTE — ED Provider Notes (Signed)
 Renaldo Fiddler    CSN: [id number] Arrival date & time: 12/29/23  1214      History   Chief Complaint Chief Complaint  Patient presents with   Cough   Fever    HPI Tammy [name] is a 38 y.o. female.   Tammy [name], 38 year old female, presents to urgent care with children for evaluation of cough and fever sore throat for 3 days.  Tmax 103.6.  Recent exposure to similar URI illness.   The history is provided by the patient. No language interpreter was used.    History reviewed. No pertinent past medical history.  Patient Active Problem List   Diagnosis Date Noted   Nonspecific syndrome suggestive of viral illness 12/29/2023   High-risk pregnancy 08/02/2015   History of cesarean delivery 08/02/2015   Malpresentation of fetus, antepartum 08/02/2015   S/P cesarean section 08/02/2015    Past Surgical History:  Procedure Laterality Date   BREAST ENHANCEMENT SURGERY Bilateral    BREAST SURGERY     CESAREAN SECTION     CESAREAN SECTION N/A 08/02/2015   Procedure: CESAREAN SECTION;  Surgeon: Conard Novak, MD;  Location: [facility] ORS;  Service: Obstetrics;  Laterality: N/A;   femoral rod placement Right    TONSILLECTOMY      OB History     Gravida  3   Para  2   Term  2   Preterm  0   AB  0   Living  2      SAB  0   IAB  0   Ectopic  0   Multiple  0   Live Births  2            Home Medications    Prior to Admission medications   Medication Sig Start Date End Date Taking? Authorizing Provider  busPIRone (BUSPAR) 15 MG tablet TAKE ONE TABLET TWICE DAILY AS NEEDED FOR ANXIETY Patient not taking: Reported on 05/02/2021 10/20/19   [provider]  etonogestrel (NEXPLANON) 68 MG IMPL implant 68 mg. Patient not taking: Reported on 05/02/2021 09/22/12   [provider]  ibuprofen (ADVIL,MOTRIN) 600 MG tablet Take 1 tablet (600 mg total) by mouth every 6 (six) hours as needed for fever, headache or mild pain. Patient not taking:  Reported on 05/02/2021 08/05/15   [place] Bing, MD  oxyCODONE-acetaminophen (PERCOCET/ROXICET) 5-325 MG per tablet Take 1-2 tablets by mouth every 6 (six) hours as needed for moderate pain or severe pain. Patient not taking: Reported on 05/02/2021 08/05/15   [place] Bing, MD  Prenatal Vit-Fe Fumarate-FA (PRENATAL MULTIVITAMIN) TABS tablet Take 1 tablet by mouth daily at 12 noon.    [provider]  sertraline (ZOLOFT) 25 MG tablet TAKE ONE TABLET EVERY DAY FOR 2 WEEKS THEN INCREASE TO TAKE TWO TABLETS EVERY DAY IF TOLERATED Patient not taking: Reported on 05/02/2021 10/20/19   [provider]  simethicone (MYLICON) 80 MG chewable tablet Chew 1 tablet (80 mg total) by mouth 4 (four) times daily as needed for flatulence. Patient not taking: Reported on 05/02/2021 08/05/15   [place] Bing, MD    Family History History reviewed. No pertinent family history.  Social History Social History   Tobacco Use   Smoking status: Never   Smokeless tobacco: Never  Vaping Use   Vaping status: Never Used  Substance Use Topics   Alcohol use: Not Currently    Comment: last use - 54mo ago   Drug use: No     Allergies  Patient has no known allergies.   Review of Systems Review of Systems  Constitutional:  Positive for fever.  HENT:  Positive for sore throat.   Respiratory:  Positive for cough.   All other systems reviewed and are negative.    Physical Exam Triage Vital Signs ED Triage Vitals  Encounter Vitals Group     BP 12/29/23 1441 123/85     Systolic BP Percentile --      Diastolic BP Percentile --      Pulse Rate 12/29/23 1441 (!) 113     Resp 12/29/23 1441 18     Temp 12/29/23 1441 99.6 F (37.6 C)     Temp Source 12/29/23 1441 Oral     SpO2 12/29/23 1441 96 %     Weight 12/29/23 1439 132 lb (59.9 kg)     Height 12/29/23 1439 5\' 4"  (1.626 m)     Head Circumference --      Peak Flow --      Pain Score 12/29/23 1439 4     Pain Loc --      Pain Education  --      Exclude from Growth Chart --    No data found.  Updated Vital Signs BP 123/85 (BP Location: Right Arm)   Pulse (!) 113   Temp 99.6 F (37.6 C) (Oral)   Resp 18   Ht 5\' 4"  (1.626 m)   Wt 132 lb (59.9 kg)   LMP 12/25/2023 (Approximate)   SpO2 96%   Breastfeeding No   BMI 22.66 kg/m   Visual Acuity Right Eye Distance:   Left Eye Distance:   Bilateral Distance:    Right Eye Near:   Left Eye Near:    Bilateral Near:     Physical Exam Vitals and nursing note reviewed.  Constitutional:      General: She is not in acute distress.    Appearance: She is well-developed and well-groomed.  HENT:     Head: Normocephalic.     Right Ear: Tympanic membrane is retracted.     Left Ear: Tympanic membrane is retracted.     Nose: Congestion present.     Mouth/Throat:     Lips: Pink.     Mouth: Mucous membranes are moist.     Pharynx: Uvula midline. Posterior oropharyngeal erythema present.  Eyes:     General: Lids are normal.     Conjunctiva/sclera: Conjunctivae normal.     Pupils: Pupils are equal, round, and reactive to light.  Neck:     Trachea: No tracheal deviation.  Cardiovascular:     Rate and Rhythm: Regular rhythm. Tachycardia present.     Pulses: Normal pulses.     Heart sounds: Normal heart sounds. No murmur heard. Pulmonary:     Effort: Pulmonary effort is normal.     Breath sounds: Normal breath sounds and air entry.  Abdominal:     General: Bowel sounds are normal.     Palpations: Abdomen is soft.     Tenderness: There is no abdominal tenderness.  Musculoskeletal:        General: Normal range of motion.     Cervical back: Normal range of motion.  Lymphadenopathy:     Cervical: No cervical adenopathy.  Skin:    General: Skin is warm and dry.     Findings: No rash.  Neurological:     General: No focal deficit present.     Mental Status: She is alert and oriented to person, place,  and time.     GCS: GCS eye subscore is 4. GCS verbal subscore is 5.  GCS motor subscore is 6.  Psychiatric:        Speech: Speech normal.        Behavior: Behavior normal. Behavior is cooperative.      UC Treatments / Results  Labs (all labs ordered are listed, but only abnormal results are displayed) Labs Reviewed  POCT RAPID STREP A (OFFICE) - Normal  CULTURE, GROUP A STREP [facility])    EKG   Radiology No results found.  Procedures Procedures (including critical care time)  Medications Ordered in UC Medications - No data to display  Initial Impression / Assessment and Plan / UC Course  I have reviewed the triage vital signs and the nursing notes.  Pertinent labs & imaging results that were available during my care of the patient were reviewed by me and considered in my medical decision making (see chart for details).    Discussed exam findings and plan of care with patient, strict go to ER precautions given.   Patient verbalized understanding to this provider.  Ddx: Viral illness, allergies Final Clinical Impressions(s) / UC Diagnoses   Final diagnoses:  Nonspecific syndrome suggestive of viral illness     Discharge Instructions      Strep is negative,culture pending,  Most likely you have a viral illness: no antibiotic as indicated at this time, May treat with OTC meds of choice. Make sure to drink plenty of fluids to stay hydrated(gatorade, water, popsicles,jello,etc), avoid caffeine products. Follow up with PCP. Return as needed.    ED Prescriptions   None    PDMP not reviewed this encounter.   Clancy Gourd, NP 12/29/23 [phone]

## 2023-12-29 NOTE — ED Triage Notes (Signed)
 Patient states cough and fever, sore throat  for 5 days Tmax 103.6.  Taking OTC Advil cold and sinus.

## 2023-12-29 NOTE — Discharge Instructions (Signed)
 Strep is negative,culture pending,  Most likely you have a viral illness: no antibiotic as indicated at this time, May treat with OTC meds of choice. Make sure to drink plenty of fluids to stay hydrated(gatorade, water, popsicles,jello,etc), avoid caffeine products. Follow up with PCP. Return as needed.

## 2024-01-01 LAB — CULTURE, GROUP A STREP (THRC)

## 2024-08-18 ENCOUNTER — Other Ambulatory Visit (HOSPITAL_BASED_OUTPATIENT_CLINIC_OR_DEPARTMENT_OTHER): Payer: Self-pay

## 2024-11-12 ENCOUNTER — Other Ambulatory Visit: Payer: Self-pay

## 2024-11-12 ENCOUNTER — Emergency Department
Admission: EM | Admit: 2024-11-12 | Discharge: 2024-11-12 | Disposition: A | Discharge status: Home / Self Care | Payer: Self-pay

## 2024-11-12 ENCOUNTER — Emergency Department: Payer: Self-pay

## 2024-11-12 DIAGNOSIS — R0789 Other chest pain: Secondary | ICD-10-CM | POA: Insufficient documentation

## 2024-11-12 DIAGNOSIS — R079 Chest pain, unspecified: Secondary | ICD-10-CM

## 2024-11-12 LAB — CBC
HCT: 41.4 % (ref 36.0–46.0)
Hemoglobin: 13.8 g/dL (ref 12.0–15.0)
MCH: 28.8 pg (ref 26.0–34.0)
MCHC: 33.3 g/dL (ref 30.0–36.0)
MCV: 86.4 fL (ref 80.0–100.0)
Platelets: 296 K/uL (ref 150–400)
RBC: 4.79 MIL/uL (ref 3.87–5.11)
RDW: 12.1 % (ref 11.5–15.5)
WBC: 9.1 K/uL (ref 4.0–10.5)
nRBC: 0 % (ref 0.0–0.2)

## 2024-11-12 LAB — BASIC METABOLIC PANEL WITH GFR
Anion gap: 12 (ref 5–15)
BUN: 16 mg/dL (ref 6–20)
CO2: 23 mmol/L (ref 22–32)
Calcium: 9.1 mg/dL (ref 8.9–10.3)
Chloride: 103 mmol/L (ref 98–111)
Creatinine, Ser: 0.54 mg/dL (ref 0.44–1.00)
GFR, Estimated: >60 mL/min
Glucose, Bld: 85 mg/dL (ref 70–99)
Potassium: 4.1 mmol/L (ref 3.5–5.1)
Sodium: 137 mmol/L (ref 135–145)

## 2024-11-12 LAB — POC URINE PREG, ED: Preg Test, Ur: NEGATIVE

## 2024-11-12 LAB — TROPONIN T, HIGH SENSITIVITY: Troponin T High Sensitivity: <15 ng/L (ref 0–19)

## 2024-11-12 LAB — D-DIMER, QUANTITATIVE: D-Dimer, Quant: 0.6 ug{FEU}/mL — ABNORMAL HIGH (ref 0.00–0.50)

## 2024-11-12 MED ORDER — IOHEXOL 350 MG/ML SOLN
75.0000 mL | Freq: Once | INTRAVENOUS | Status: AC | PRN
  Administered 2024-11-12: 75 mL via INTRAVENOUS

## 2024-11-12 NOTE — ED Triage Notes (Signed)
 Pt to ED via POV from home. Pt reports centralized CP with SOB x3 days. Denies N/V/D, fever, cough congestion. No cardiac hx.

## 2024-11-12 NOTE — ED Notes (Signed)
 See triage note  Presents with centralized chest pain   States pain started 3 days ago  Pain is non radiating Denies any n/v/d or fever  Also denies cough

## 2024-11-12 NOTE — ED Provider Notes (Signed)
 "  [facility] Provider Note    Event Date/Time   First MD Initiated Contact with Patient 11/12/24 1327     (approximate)   History   Chest Pain   HPI  NEKIA MAXHAM is a 39 y.o. female with no significant past medical history presenting to the emergency department complaining of chest tightness and shortness of breath which has been ongoing for the last 3 days.  The patient also reports a feeling of palpitations with a racing heart.  She denies any leg swelling, nausea, vomiting, diarrhea, recent surgeries, or recent travel.  She is a non-smoker.  She does take birth control pills.  She denies any history of blood clots.     Physical Exam   Triage Vital Signs: ED Triage Vitals  Encounter Vitals Group     BP 11/12/24 1144 126/81     Girls Systolic BP Percentile --      Girls Diastolic BP Percentile --      Boys Systolic BP Percentile --      Boys Diastolic BP Percentile --      Pulse Rate 11/12/24 1144 (!) 102     Resp 11/12/24 1144 20     Temp 11/12/24 1144 98 F (36.7 C)     Temp Source 11/12/24 1144 Oral     SpO2 11/12/24 1144 98 %     Weight 11/12/24 1326 132 lb 0.9 oz (59.9 kg)     Height 11/12/24 1326 5' 4 (1.626 m)     Head Circumference --      Peak Flow --      Pain Score 11/12/24 1144 3     Pain Loc --      Pain Education --      Exclude from Growth Chart --     Most recent vital signs: Vitals:   11/12/24 1144  BP: 126/81  Pulse: (!) 102  Resp: 20  Temp: 98 F (36.7 C)  SpO2: 98%     General: Awake, no distress.  CV:  Good peripheral perfusion.  Tachycardic rate and rhythm Resp:  Normal effort.  Clear to auscultation Abd:  No distention.  Other:  No pitting edema to bilateral lower extremities   ED Results / Procedures / Treatments   Labs (all labs ordered are listed, but only abnormal results are displayed) Labs Reviewed  BASIC METABOLIC PANEL WITH GFR  CBC  POC URINE PREG, ED  TROPONIN T, HIGH SENSITIVITY   TROPONIN T, HIGH SENSITIVITY     EKG  ED ECG REPORT I, Reche CHRISTELLA Leventhal, the attending physician, personally viewed and interpreted this ECG.  Date: 11/12/2024 Rate: 116 bpm Rhythm: Sinus tachycardia QRS Axis: normal Intervals: Prolonged QT interval ST/T Wave abnormalities: normal Narrative Interpretation: no evidence of acute ischemia  ED  REPEAT ECG REPORT I, Reche CHRISTELLA Leventhal, the attending physician, personally viewed and interpreted this ECG.  Date: 11/12/2024 Rate: 80 bpm Rhythm: normal sinus rhythm QRS Axis: normal Intervals: normal ST/T Wave abnormalities: normal Narrative Interpretation: no evidence of acute ischemia     RADIOLOGY Chest x-ray was independently reviewed and interpreted by myself as no acute pathology    PROCEDURES:  Critical Care performed: No  Procedures   MEDICATIONS ORDERED IN ED: Medications - No data to display   IMPRESSION / MDM / ASSESSMENT AND PLAN / ED COURSE  I reviewed the triage vital signs and the nursing notes.  Differential diagnosis includes, but is not limited to, ACS, aortic dissection, pulmonary embolism, cardiac tamponade, pneumothorax, pneumonia, pericarditis, myocarditis, GI-related causes including esophagitis/gastritis, and musculoskeletal chest wall pain.     Patient's presentation is most consistent with acute presentation with potential threat to life or bodily function.  Patient is a 39 year old female with no significant past medical history presenting to the emergency department complaining of chest tightness and shortness of breath which has been ongoing for the last 3 days.  Patient's troponin is negative.  Her initial EKG showed tachycardia with concerns for a possible prolonged QT.  On exam she was also noted to be tachycardic.  D-dimer was added to workup.  Repeat EKG shows a normal sinus rhythm with normal intervals.  D-dimer is slightly elevated.  This result  was discussed with the patient.  Discussed plan for CTA of the chest for further evaluation and she is agreeable.  The patient's care was signed out to the oncoming provider pending completion of CTA and disposition.        FINAL CLINICAL IMPRESSION(S) / ED DIAGNOSES   Final diagnoses:  Chest pain, unspecified type     Rx / DC Orders   ED Discharge Orders     None        Note:  This document was prepared using Dragon voice recognition software and may include unintentional dictation errors.   Rexford Reche HERO, MD 11/12/24 1538  "

## 2024-11-12 NOTE — ED Provider Notes (Signed)
----------------------------------------- °  4:35 PM on 11/12/2024 -----------------------------------------  I took over care of this patient from Dr. Rexford.  CT of the chest is negative.  On reassessment patient is not having any active chest pain.  She is stable for discharge home at this time.  I counseled her on the results of the workup and answered all her questions.  I gave strict return precautions, and she expressed understanding.   Jacolyn Pae, MD 11/12/24 [phone]

## 2024-11-17 ENCOUNTER — Ambulatory Visit: Payer: Self-pay

## 2024-11-17 VITALS — BP 121/81 | HR 87 | Ht 64.0 in | Wt 138.2 lb

## 2024-11-17 DIAGNOSIS — Z3046 Encounter for surveillance of implantable subdermal contraceptive: Secondary | ICD-10-CM

## 2024-11-17 DIAGNOSIS — Z3009 Encounter for other general counseling and advice on contraception: Secondary | ICD-10-CM

## 2024-11-17 DIAGNOSIS — Z30011 Encounter for initial prescription of contraceptive pills: Secondary | ICD-10-CM

## 2024-11-17 DIAGNOSIS — Z3045 Encounter for surveillance of transdermal patch hormonal contraceptive device: Secondary | ICD-10-CM

## 2024-11-17 MED ORDER — NORELGESTROMIN-ETH ESTRADIOL 150-35 MCG/24HR TD PTWK: 1.0000 | MEDICATED_PATCH | TRANSDERMAL | 3 refills | Status: AC

## 2024-11-17 NOTE — Progress Notes (Signed)
 Pt is here for Nexplanon removal and BC change. Nexplanon removed successfully from the Lt Arm by Damien Satchel, [facility]. And pt tolerated well to the removal process with no complications. The patient was dispensed Xulane patches #3 packs. I provided counseling regarding its usage, the side effects and when to call clinic. Opportunity given to pt to ask questions for any clarifications. Questions Answered. Wilkie Drought, RN.

## 2024-11-17 NOTE — Progress Notes (Signed)
 " [facility]  [facility] [address], [address] [place] [address] Main phone: [phone]  Family Planning Visit - Initial Visit  Subjective:  Tammy [name] is a 39 y.o.  G3P2003   being seen today for contraception visit. The patient is currently using hormonal implant for pregnancy prevention. Patient does not want a pregnancy in the next year.   Patient reports they are looking for a method with the following characteristics:  Method they can control starting and stopping Method that does not involve too much memory  Patient has the following medical conditions: Patient Active Problem List   Diagnosis Date Noted   Nonspecific syndrome suggestive of viral illness 12/29/2023   High-risk pregnancy 08/02/2015   History of cesarean delivery 08/02/2015   Malpresentation of fetus, antepartum 08/02/2015   S/P cesarean section 08/02/2015   Chief Complaint  Patient presents with   Annual Exam    Pt is here for Nexplanon removal    HPI Patient reports desire for Nexplanon removal. She had it placed in 2023 and has had bleeding problems since 1 week after placement. She also has had problems with vaginal discharge with the implant. She reports she recently had STI testing and she did not have BV, yeast or other vaginal infections. She does not want vaginal swabs repeated today.  No history of breast cancer, hypertension, migraine no hx of DVT/PE, she does not smoke.  She would like to switch to the birth control patch temporarily, and has an appointment at an ob/gyn provider to evaluate her bleeding and vaginal discharge next month.  Patient denies other concerns. She reports she would just like her birth control implant removed and the patch instead while she awaits work up by ob/gyn next month.   She is in a hurry today and declines full physical exam, will have that done at her gyn visit.  Review of Systems  All other systems  reviewed and are negative.  Diabetes screening This patient is 39 y.o. with a BMI of Body mass index is 23.72 kg/m.SABRA  Is patient eligible for diabetes screening (age >35 and BMI >25)?  no  Was Hgb A1c ordered? no  STI screening Patient reports 1 of partners in last year.  Does this patient desire STI screening?  No - declines  Cervical Cancer Screening   Pap done 07/30/24   Health Maintenance Due  Topic Date Due   Hepatitis C Screening  Never done   DTaP/Tdap/Td (1 - Tdap) Never done   Hepatitis B Vaccines 19-59 Average Risk (1 of 3 - 19+ 3-dose series) Never done   HPV VACCINES (1 - 3-dose SCDM series) Never done   Cervical Cancer Screening (HPV/Pap Cotest)  Never done   Influenza Vaccine  Never done   COVID-19 Vaccine (1 - 2025-26 season) Never done   The following portions of the patient's history were reviewed and updated as appropriate: allergies, current medications, past family history, past medical history, past social history, past surgical history and problem list. Problem list updated.  See flowsheet for further details and programmatic requirements Hyperlink available at the top of the signed note in blue.  Flow sheet content below:  Pregnancy Intention Screening Does the patient want to become pregnant in the next year?: No Does the patient's partner want to become pregnant in the next year?: N/A Would the patient like to discuss contraceptive options today?: Yes Other:  Password: [id number] Is it okay to contact you by mail?:  Yes Difficulty accessing hygiene products (feminine products/soap) in the last 3 months: No Sexual History What age did you start your period?: 16 How often do you have your period?:  (pt report almost everyday for the past 2 years) Date of last sex?: 11/16/24 Has the patient had unprotected sex within the last 5 days?: No Do you have sex with men, women, both men and women?: Men only In the past 2 months how many partners have you had sex  with?: 1 In the past 12 months, how many partners have you had sex with?: 1 Is it possible that any of your sex partners in the past 12 months had sex with someone else whild they were still in a sexual relationship with you?: No What ways do you have sex?: Vaginal Do you or your partner use condoms and/or dental dams every time you have vaginal, oral or anal sex?: Sometimes Do you douche?: No Date of last HIV test?: 08/01/15 Have you ever had an STD?: No Have any of your partners had an STD?: No Have you or your partner ever shot up drugs?: No Have any of your partners used drugs in the past?: No Have you or your partners exchanged money or drugs for sex?: No  Objective:   Vitals:   11/17/24 1309  BP: 121/81  Pulse: 87  Weight: 138 lb 3.2 oz (62.7 kg)  Height: 5' 4 (1.626 m)   Physical Exam Constitutional:      Appearance: Normal appearance.  HENT:     Head: Normocephalic.     Mouth/Throat:     Mouth: Mucous membranes are moist.  Eyes:     General: No scleral icterus.       Right eye: No discharge.        Left eye: No discharge.  Pulmonary:     Effort: Pulmonary effort is normal.  Skin:    General: Skin is warm and dry.  Neurological:     General: No focal deficit present.     Mental Status: She is alert.  Psychiatric:        Mood and Affect: Mood normal.        Behavior: Behavior normal.     Assessment and Plan:  Tammy [name] is a 39 y.o. female presenting to the [facility] Department for an initial annual wellness/contraceptive visit  Family planning  Contraception counseling:  Reviewed options based on patient desire and reproductive life plan. Patient is interested in Contraceptive Patch. This was provided to the patient today.   Risks, benefits, and typical effectiveness rates were reviewed.  Questions were answered.  Written information was also given to the patient to review.    The patient will follow up as scheduled with ob/gyn  provider for surveillance.  The patient was told to call with any further questions, or with any concerns about this method of contraception.  Emphasized use of condoms 100% of the time for STI prevention.  Emergency Contraception Precautions (ECP): Patient assessed for need of ECP. She is not a candidate based on LARC in place and unexpired .   2. Initial encounter for management of contraceptive patch use  - Advised she can apply patch today and discussed 3 weeks on, 1 week no patch, rotating sites. Use back up method for 1 week.  - norelgestromin -ethinyl estradiol  (XULANE) 150-35 MCG/24HR transdermal patch; Place 1 patch onto the skin once a week.; Refill: 3  3. Nexplanon removal  Procedure:  Nexplanon Removal Patient identified,  informed consent performed, consent signed.   Appropriate time out taken. Nexplanon site identified in the patient's left arm.  Area prepped in usual sterile fashon. 1 ml of 1% lidocaine with Epinephrine was used to anesthetize the area at the distal end of the implant and along implant site. A small stab incision was made right beside the implant on the distal portion.  The Nexplanon rod was grasped using hemostats and removed without difficulty.  There was minimal blood loss. There were no complications.  Steri-strips were applied over the small incision.  A pressure bandage was applied to reduce any bruising.  The patient tolerated the procedure well and was given post procedure instructions.     Nexplanon:   Counseled patient to take OTC analgesic starting as soon as lidocaine starts to wear off and take regularly for at least 48 hr to decrease discomfort.  Specifically to take with food or milk to decrease stomach upset and for IB 600 mg (3 tablets) every 6 hrs; IB 800 mg (4 tablets) every 8 hrs; or Aleve 2 tablets every 12 hrs.   Return as needed for family planning needs. Follow up as scheduled with ob/gyn provider.  No future appointments.  Damien FORBES Satchel, NP "
